# Patient Record
Sex: Female | Born: 1950 | Race: Black or African American | Hispanic: No | Marital: Married | Smoking: Former smoker
Health system: Southern US, Community
[De-identification: ages and names within clinical notes are randomized; demographics above are authoritative.]

## PROBLEM LIST (undated history)

## (undated) DIAGNOSIS — E119 Type 2 diabetes mellitus without complications: Secondary | ICD-10-CM

## (undated) DIAGNOSIS — Z720 Tobacco use: Secondary | ICD-10-CM

## (undated) DIAGNOSIS — F32A Depression, unspecified: Secondary | ICD-10-CM

## (undated) DIAGNOSIS — C9110 Chronic lymphocytic leukemia of B-cell type not having achieved remission: Secondary | ICD-10-CM

## (undated) DIAGNOSIS — F329 Major depressive disorder, single episode, unspecified: Secondary | ICD-10-CM

## (undated) DIAGNOSIS — C9590 Leukemia, unspecified not having achieved remission: Secondary | ICD-10-CM

## (undated) DIAGNOSIS — I10 Essential (primary) hypertension: Secondary | ICD-10-CM

## (undated) DIAGNOSIS — D649 Anemia, unspecified: Secondary | ICD-10-CM

## (undated) DIAGNOSIS — E876 Hypokalemia: Secondary | ICD-10-CM

## (undated) DIAGNOSIS — F1020 Alcohol dependence, uncomplicated: Secondary | ICD-10-CM

## (undated) HISTORY — DX: Type 2 diabetes mellitus without complications: E11.9

## (undated) HISTORY — DX: Anemia, unspecified: D64.9

## (undated) HISTORY — DX: Depression, unspecified: F32.A

## (undated) HISTORY — PX: ABDOMINAL HYSTERECTOMY: SUR658

## (undated) HISTORY — DX: Chronic lymphocytic leukemia of B-cell type not having achieved remission: C91.10

## (undated) HISTORY — DX: Tobacco use: Z72.0

## (undated) HISTORY — DX: Alcohol dependence, uncomplicated: F10.20

## (undated) HISTORY — DX: Leukemia, unspecified not having achieved remission: C95.90

## (undated) HISTORY — PX: PORT A CATH INJECTION (ARMC HX): HXRAD1731

## (undated) HISTORY — DX: Hypokalemia: E87.6

## (undated) HISTORY — DX: Major depressive disorder, single episode, unspecified: F32.9

## (undated) HISTORY — DX: Essential (primary) hypertension: I10

---

## 2004-05-15 DIAGNOSIS — F1020 Alcohol dependence, uncomplicated: Secondary | ICD-10-CM

## 2004-05-15 HISTORY — DX: Alcohol dependence, uncomplicated: F10.20

## 2011-12-21 DIAGNOSIS — Z1231 Encounter for screening mammogram for malignant neoplasm of breast: Secondary | ICD-10-CM | POA: Insufficient documentation

## 2011-12-21 DIAGNOSIS — I10 Essential (primary) hypertension: Secondary | ICD-10-CM | POA: Insufficient documentation

## 2011-12-21 DIAGNOSIS — M8580 Other specified disorders of bone density and structure, unspecified site: Secondary | ICD-10-CM | POA: Insufficient documentation

## 2011-12-21 DIAGNOSIS — E7800 Pure hypercholesterolemia, unspecified: Secondary | ICD-10-CM | POA: Insufficient documentation

## 2011-12-21 DIAGNOSIS — Z720 Tobacco use: Secondary | ICD-10-CM

## 2011-12-21 DIAGNOSIS — Z1211 Encounter for screening for malignant neoplasm of colon: Secondary | ICD-10-CM | POA: Insufficient documentation

## 2011-12-21 DIAGNOSIS — K5900 Constipation, unspecified: Secondary | ICD-10-CM | POA: Insufficient documentation

## 2011-12-21 HISTORY — DX: Tobacco use: Z72.0

## 2014-03-30 DIAGNOSIS — C9110 Chronic lymphocytic leukemia of B-cell type not having achieved remission: Secondary | ICD-10-CM | POA: Insufficient documentation

## 2014-04-28 DIAGNOSIS — J019 Acute sinusitis, unspecified: Secondary | ICD-10-CM | POA: Insufficient documentation

## 2014-05-31 DIAGNOSIS — D649 Anemia, unspecified: Secondary | ICD-10-CM

## 2014-05-31 DIAGNOSIS — D6489 Other specified anemias: Secondary | ICD-10-CM

## 2014-05-31 HISTORY — DX: Other specified anemias: D64.89

## 2014-08-17 DIAGNOSIS — T7840XA Allergy, unspecified, initial encounter: Secondary | ICD-10-CM | POA: Insufficient documentation

## 2014-08-17 DIAGNOSIS — Z5111 Encounter for antineoplastic chemotherapy: Secondary | ICD-10-CM | POA: Insufficient documentation

## 2014-09-07 DIAGNOSIS — R61 Generalized hyperhidrosis: Secondary | ICD-10-CM | POA: Insufficient documentation

## 2014-09-07 DIAGNOSIS — R509 Fever, unspecified: Secondary | ICD-10-CM | POA: Insufficient documentation

## 2014-12-21 DIAGNOSIS — Z95828 Presence of other vascular implants and grafts: Secondary | ICD-10-CM | POA: Insufficient documentation

## 2015-02-01 DIAGNOSIS — F329 Major depressive disorder, single episode, unspecified: Secondary | ICD-10-CM | POA: Insufficient documentation

## 2015-06-22 DIAGNOSIS — E876 Hypokalemia: Secondary | ICD-10-CM | POA: Insufficient documentation

## 2015-09-09 ENCOUNTER — Encounter (HOSPITAL_COMMUNITY): Payer: Self-pay

## 2015-09-15 ENCOUNTER — Encounter (HOSPITAL_COMMUNITY): Payer: Self-pay | Admitting: Oncology

## 2015-09-15 ENCOUNTER — Encounter (HOSPITAL_COMMUNITY): Payer: BLUE CROSS/BLUE SHIELD | Attending: Oncology | Admitting: Oncology

## 2015-09-15 VITALS — BP 165/68 | HR 55 | Temp 98.2°F | Resp 18 | Ht 64.0 in | Wt 165.8 lb

## 2015-09-15 DIAGNOSIS — R110 Nausea: Secondary | ICD-10-CM | POA: Diagnosis not present

## 2015-09-15 DIAGNOSIS — C9110 Chronic lymphocytic leukemia of B-cell type not having achieved remission: Secondary | ICD-10-CM

## 2015-09-15 DIAGNOSIS — F341 Dysthymic disorder: Secondary | ICD-10-CM | POA: Diagnosis not present

## 2015-09-15 DIAGNOSIS — F329 Major depressive disorder, single episode, unspecified: Secondary | ICD-10-CM

## 2015-09-15 DIAGNOSIS — I10 Essential (primary) hypertension: Secondary | ICD-10-CM | POA: Insufficient documentation

## 2015-09-15 DIAGNOSIS — Z87891 Personal history of nicotine dependence: Secondary | ICD-10-CM

## 2015-09-15 DIAGNOSIS — F4321 Adjustment disorder with depressed mood: Secondary | ICD-10-CM

## 2015-09-15 MED ORDER — IBRUTINIB 140 MG PO CAPS: 420.0000 mg | ORAL_CAPSULE | Freq: Every day | ORAL | 0 refills | Status: DC

## 2015-09-15 NOTE — Assessment & Plan Note (Addendum)
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  Initially treated with BR by Dr. [name] at [facility].  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  Oncology history is developed.  Labs in 2 weeks with port flush: CBC diff, CMET, LDH.  I personally reviewed and went over laboratory results with the patient.  The results are noted within this dictation.  Labs in ~ 8 weeks: CBC diff, CMET, LDH.  Port flushes every 6-8 weeks.  She notes persistent nausea and she is provided recommendations regarding her antiemetic regimen to combat this issue.  Compliance is encouraged with Imbruvica.  Return in approximately 8 weeks for follow-up.

## 2015-09-15 NOTE — Patient Instructions (Signed)
[place] at [facility] Discharge Instructions  RECOMMENDATIONS MADE BY THE CONSULTANT AND ANY TEST RESULTS WILL BE SENT TO YOUR REFERRING PHYSICIAN.  Labs in 2 weeks with port flush. Port flushes every 6 weeks. Return for follow-up in ~ 8 weeks.  Thank you for choosing [place] at [facility] to provide your oncology and hematology care.  To afford each patient quality time with our provider, please arrive at least 15 minutes before your scheduled appointment time.   Beginning January 23rd 2017 lab work for the [facility] will be done in the  Main lab at [facility] on 1st floor. If you have a lab appointment with the [place] please come in thru the  Main Entrance and check in at the main information desk  You need to re-schedule your appointment should you arrive 10 or more minutes late.  We strive to give you quality time with our providers, and arriving late affects you and other patients whose appointments are after yours.  Also, if you no show three or more times for appointments you may be dismissed from the clinic at the providers discretion.     Again, thank you for choosing [facility].  Our hope is that these requests will decrease the amount of time that you wait before being seen by our physicians.       _____________________________________________________________  Should you have questions after your visit to [facility], please contact our office at [phone] between the hours of 8:30 a.m. and 4:30 p.m.  Voicemails left after 4:30 p.m. will not be returned until the following business day.  For prescription refill requests, have your pharmacy contact our office.         Resources For Cancer Patients and their Caregivers ? American Cancer Society: Can assist with transportation, wigs, general needs, runs Look Good Feel Better.        971-444-1594 ? Cancer Care: Provides financial  assistance, online support groups, medication/co-pay assistance.  1-800-813-HOPE 206-539-7198) ? [place] Assists [place] cancer patients and their families through emotional , educational and financial support.  813-131-5501 ? [place] DSS Where to apply for food stamps, Medicaid and utility assistance. [phone] [phone] ? RCATS: Transportation to medical appointments. 4162478238 ? Social Security Administration: May apply for disability if have a Stage IV cancer. 706-305-5891 [phone] ? [employer], Disability and Transit Services: Assists with nutrition, care and transit needs. [place] Support Programs: @10RELATIVEDAYS @ > Cancer Support Group  2nd Tuesday of the month 1pm-2pm, Journey Room  > Creative Journey  3rd Tuesday of the month 1130am-1pm, Journey Room  > Look Good Feel Better  1st Wednesday of the month 10am-12 noon, Journey Room (Call [place] to register [phone])

## 2015-09-16 ENCOUNTER — Encounter (HOSPITAL_COMMUNITY): Payer: Self-pay | Admitting: Oncology

## 2015-09-16 NOTE — Progress Notes (Signed)
[facility] Hematology/Oncology Consultation   Name: Brittany Rowland      MRN: [id number]   Date: 09/16/2015 Time:9:52 PM   REFERRING PHYSICIAN:  Everardo All, MD [facility])  REASON FOR CONSULT:  Transfer of medical oncology care.   DIAGNOSIS:  CLL    CLL (chronic lymphocytic leukemia) ([place])   12/16/2011 Initial Diagnosis    CLL (chronic lymphocytic leukemia), stage A ([place]), stage 0 (Rai)      01/13/2013 Pathology Results    Monoclonal B cell population is detected with kapp light chain restriction, representing 82% of peripheral leukocytes. B cell population expresses CD19, CD20, CD11C, CD23, and aberrant CD5. No blasts.      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        03/30/2014 Cancer Staging    CLL Stage B ([place]), Stage I (Rai), now with doubling of WBC and approximate 6 months and widespread adenopathy, dropping hemoglobin, but not under 11 g/dL yet, fatigue and weakness      05/31/2014 Cancer Staging    Stage III by both Binet and Rai staging systems, HGB 10.6 g/dL.      07/07/2014 Imaging    CT CAP- extensive adenopathy w/in low neck, chest, abdomen, and pelvis.  Centrilobular emphysema. Nonspecific 5 mm LLL pulmonary nodule. Nonspecific focal left-sided pleural nodularity. 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      08/02/2014 - 11/02/2014 Chemotherapy    Bendamustine/Rituxan x 4 cycles with normalization of HGB and WBC and PLT count with disappearance of all symptoms and lymph nodes      09/07/2014 Adverse Reaction    Hospitalization with fever and chills, acute bronchitis, negative blood cultures. Comlicated by Levaquin skin rash.  Bendamustine dose reduced for next cycle.      09/08/2014 Echocardiogram    Left ventricular wall motion and contractility are within normal limits.  The estimated ejection fraction is greater than 65%.  Normal left  ventricular diastolic filling is observed.      12/21/2014 Treatment Plan Change    Chemotherapy discontinued following 4 cycles of therapy due to tremendouns response and disappearance of all symptoms and lymph nodes.      12/21/2014 Remission         06/22/2015 Relapse/Recurrence    Per Dr. [name]: "Now relapsing changes in her WBC count, lymphocyte count, LDH level. Before she gets symptomatic again, I think is worthy of starting her on Ibrutinib while she has a great performance status..."      06/29/2015 -  Chemotherapy    Ibrutinib initiated         HISTORY OF PRESENT ILLNESS:  Brittany Rowland is a 65 y.o. female with a medical history significant for HTN, hypercholesterolemia, osteopenia, reactive depression who is referred to the [facility] for transfer of her medical oncology care for CLL, currently on Ibrutinib.  I reviewed the patient's treatment plan with her. She takes 420 mg of Imbruvica daily. There is documentation of issues with compliance. She notes that she may have missed 2 days of treatment last month and, thus far, has missed 2 doses.  She complains of fatigue which is likely multi factorial. She does work third shift. She denies any B symptoms. She feels that overall she does well with therapy. She denies nausea with her medication (although chronic), no vomiting. No change in bowel habits.   She does have reactive  depression secondary to the death of her mother. She and her mother live together and this is caused a significant life change the patient.  Treatment course of her CLL is detailed above under oncology history.   Review of Systems  Constitutional: Positive for malaise/fatigue (chronic). Negative for chills and fever.  HENT: Negative.   Eyes: Negative.   Respiratory: Negative.  Negative for cough and shortness of breath.   Cardiovascular: Negative.  Negative for chest pain and palpitations.  Gastrointestinal: Negative.  Negative for  abdominal pain, constipation, diarrhea, nausea and vomiting.  Genitourinary: Negative.  Negative for dysuria.  Musculoskeletal: Negative.   Skin: Negative.  Negative for itching and rash.  Neurological: Negative.  Negative for weakness.  Endo/Heme/Allergies: Negative.   Psychiatric/Behavioral: Positive for depression.  14 point review of systems was performed and is negative except as detailed under history of present illness and above    PAST MEDICAL HISTORY:   Past Medical History:  Diagnosis Date  . Anemia   . CLL (chronic lymphocytic leukemia) ([place])   . Depression   . Diabetes mellitus without complication ([place])   . Hypertension   . Hypokalemia   . Leukemia (HCC)     ALLERGIES: Allergies  Allergen Reactions  . Aspirin Nausea And Vomiting  . Levaquin  [Levofloxacin In D5w] Rash      MEDICATIONS: I have reviewed the patient's current medications.    No current outpatient prescriptions on file prior to visit.   No current facility-administered medications on file prior to visit.      PAST SURGICAL HISTORY History reviewed. No pertinent surgical history.  FAMILY HISTORY: Family History  Problem Relation Age of Onset  . Heart failure Mother   . Diabetes Mother   . Hypertension Mother   . Heart failure Father   Mothers deceased at the age of 34 secondary to complications of congestive heart failure. Father is still alive at the age of 13 and living in a nursing home.   SOCIAL HISTORY:  reports that she quit smoking about a year ago. Her smoking use included Cigarettes. She has a 45.00 pack-year smoking history. She has quit using smokeless tobacco. She reports that she drinks about 1.2 oz of alcohol per week . She reports that she does not use drugs. She enjoys having a drink of brandy once per week. She admits to being a [place], [place]. 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 She is married 22 years. She has no children.  Social History   Social  History  . Marital status: Married    Spouse name: N/A  . Number of children: N/A  . Years of education: N/A   Social History Main Topics  . Smoking status: Former Smoker    Packs/day: 1.00    Years: 45.00    Types: Cigarettes    Quit date: 09/15/2014  . Smokeless tobacco: Former Systems developer  . Alcohol use 1.2 oz/week    2 Shots of liquor per week  . Drug use: No  . Sexual activity: Not Asked   Other Topics Concern  . None   Social History Narrative  . None    PERFORMANCE STATUS: The patient's performance status is 1 - Symptomatic but completely ambulatory  PHYSICAL EXAM: Most Recent Vital Signs: Blood pressure (!) 165/68, pulse (!) 55, temperature 98.2 F (36.8 C), temperature source Oral, resp. rate 18, height 5\' 4"  (1.626 m), weight 165 lb 12.8 oz (75.2 kg), SpO2 99 %. General appearance: alert, cooperative, appears  stated age, fatigued, no distress and moderately obese Head: Normocephalic, without obvious abnormality, atraumatic Eyes: negative findings: lids and lashes normal, conjunctivae and sclerae normal and corneas clear Throat: lips, mucosa, and tongue normal; teeth and gums normal Lungs: clear to auscultation bilaterally and normal percussion bilaterally Heart: regular rate and rhythm, S1, S2 normal, no murmur, click, rub or gallop Abdomen: soft, non-tender; bowel sounds normal; no masses,  no organomegaly Extremities: extremities normal, atraumatic, no cyanosis or edema Skin: Skin color, texture, turgor normal. No rashes or lesions Lymph nodes: Cervical, supraclavicular, and axillary nodes normal. Neurologic: Grossly normal  LABORATORY DATA:  No results found for this or any previous visit (from the past 48 hour(s)).         RADIOGRAPHY: No results found.     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 She infrequently misses  doses. She seems to have no difficulty with tolerance. Medication was initiated by Dr. [name] in May of this year.  Initially treated with BR by Dr. [name] at [facility].  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 Records indicate this is her sole genetic abnormaltiy.   Oncology history is developed.  Labs in 2 weeks with port flush: CBC diff, CMET, LDH.  I personally reviewed and went over laboratory results with the patient.  The results are noted within this dictation.  Labs in ~ 8 weeks: CBC diff, CMET, LDH.  Port flushes every 6-8 weeks.  She notes persistent nausea and she is provided recommendations regarding her antiemetic regimen to combat this issue. She does not tie her nausea specifically to her medication. If it persists we could consider referral to GI.   Compliance is encouraged with Imbruvica.  She still has some depression and difficulty dealing with her mother's death but she feels she is slowly improving.   Return in approximately 8 weeks for follow-up.   ORDERS PLACED FOR THIS ENCOUNTER: Orders Placed This Encounter  Procedures  . CBC with Differential  . Comprehensive metabolic panel  . Lactate dehydrogenase  . CBC with Differential  . Comprehensive metabolic panel  . Lactate dehydrogenase    MEDICATIONS PRESCRIBED THIS ENCOUNTER: Meds ordered this encounter  Medications  . ALPRAZolam (XANAX) 0.25 MG tablet    Sig: Take one tablet by mouth every 6 to 8 hours as needed for anxiety.  . Multiple Vitamins-Minerals (THERA-M) TABS    Sig: Take by mouth.  . potassium chloride (K-DUR) 10 MEQ tablet    Sig: Take by mouth.  . DISCONTD: ibrutinib (IMBRUVICA) 140 MG capsul    Sig: Take 3 caps daily as directed by oncologist.  . ibrutinib (IMBRUVICA) 140 MG capsul    Sig: Take 3 capsules (420 mg total) by mouth daily.    Dispense:  90 capsule    Refill:  0    Order Specific Question:    Supervising Provider    Answer:   Patrici Ranks [id number]    All questions were answered. The patient knows to call the clinic with any problems, questions or concerns. We can certainly see the patient much sooner if necessary.  This note is electronically signed by: Molli Hazard, MD  :09/16/2015 9:52 PM

## 2015-10-03 ENCOUNTER — Encounter (HOSPITAL_COMMUNITY): Payer: BLUE CROSS/BLUE SHIELD

## 2015-10-05 ENCOUNTER — Encounter (HOSPITAL_COMMUNITY): Payer: BLUE CROSS/BLUE SHIELD | Attending: Oncology

## 2015-10-05 VITALS — BP 180/71 | HR 58 | Temp 98.1°F | Resp 18 | Wt 165.0 lb

## 2015-10-05 DIAGNOSIS — Z95828 Presence of other vascular implants and grafts: Secondary | ICD-10-CM | POA: Diagnosis present

## 2015-10-05 DIAGNOSIS — Z452 Encounter for adjustment and management of vascular access device: Secondary | ICD-10-CM | POA: Diagnosis not present

## 2015-10-05 DIAGNOSIS — C9110 Chronic lymphocytic leukemia of B-cell type not having achieved remission: Secondary | ICD-10-CM | POA: Diagnosis not present

## 2015-10-05 LAB — LACTATE DEHYDROGENASE: LDH: 190 U/L (ref 98–192)

## 2015-10-05 LAB — CBC WITH DIFFERENTIAL/PLATELET
BASOS ABS: 0 10*3/uL (ref 0.0–0.1)
Basophils Relative: 0 %
Eosinophils Absolute: 0.1 10*3/uL (ref 0.0–0.7)
Eosinophils Relative: 1 %
HEMATOCRIT: 37 % (ref 36.0–46.0)
HEMOGLOBIN: 12.2 g/dL (ref 12.0–15.0)
LYMPHS PCT: 75 %
Lymphs Abs: 11.2 10*3/uL — ABNORMAL HIGH (ref 0.7–4.0)
MCH: 28.2 pg (ref 26.0–34.0)
MCHC: 33 g/dL (ref 30.0–36.0)
MCV: 85.6 fL (ref 78.0–100.0)
MONOS PCT: 4 %
Monocytes Absolute: 0.6 10*3/uL (ref 0.1–1.0)
NEUTROS PCT: 20 %
Neutro Abs: 3 10*3/uL (ref 1.7–7.7)
Platelets: 258 10*3/uL (ref 150–400)
RBC: 4.32 MIL/uL (ref 3.87–5.11)
RDW: 13.9 % (ref 11.5–15.5)
WBC: 14.9 10*3/uL — AB (ref 4.0–10.5)

## 2015-10-05 LAB — COMPREHENSIVE METABOLIC PANEL
ALBUMIN: 4.1 g/dL (ref 3.5–5.0)
ALK PHOS: 43 U/L (ref 38–126)
ALT: 16 U/L (ref 14–54)
AST: 23 U/L (ref 15–41)
Anion gap: 8 (ref 5–15)
BILIRUBIN TOTAL: 0.8 mg/dL (ref 0.3–1.2)
BUN: 15 mg/dL (ref 6–20)
CO2: 22 mmol/L (ref 22–32)
CREATININE: 0.83 mg/dL (ref 0.44–1.00)
Calcium: 8.9 mg/dL (ref 8.9–10.3)
Chloride: 108 mmol/L (ref 101–111)
GFR calc Af Amer: >60 mL/min (ref >60)
GFR calc non Af Amer: >60 mL/min (ref >60)
GLUCOSE: 116 mg/dL — AB (ref 65–99)
Potassium: 3 mmol/L — ABNORMAL LOW (ref 3.5–5.1)
Sodium: 138 mmol/L (ref 135–145)
Total Protein: 6.9 g/dL (ref 6.5–8.1)

## 2015-10-05 MED ORDER — SODIUM CHLORIDE 0.9% FLUSH
20.0000 mL | INTRAVENOUS | Status: DC | PRN
  Administered 2015-10-05: 20 mL via INTRAVENOUS
  Filled 2015-10-05: qty 20

## 2015-10-05 MED ORDER — HEPARIN SOD (PORK) LOCK FLUSH 100 UNIT/ML IV SOLN
500.0000 [IU] | Freq: Once | INTRAVENOUS | Status: AC
  Administered 2015-10-05: 500 [IU] via INTRAVENOUS

## 2015-10-05 NOTE — Progress Notes (Signed)
Brittany Rowland presented for Portacath access and flush.  Proper placement of portacath confirmed by CXR.  Portacath located right chest wall accessed with  H 20 needle.  Good blood return present. Labs drawn thru port as ordered. Portacath flushed with 36ml NS and 500U/3ml Heparin and needle removed intact.  Procedure tolerated well and without incident.

## 2015-10-05 NOTE — Patient Instructions (Signed)
[place] at [facility] Discharge Instructions  RECOMMENDATIONS MADE BY THE CONSULTANT AND ANY TEST RESULTS WILL BE SENT TO YOUR REFERRING PHYSICIAN.  Port flushed with labs drawn today. Follow-up as scheduled. Call clinic for any questions or concerns  Thank you for choosing [place] at [facility] to provide your oncology and hematology care.  To afford each patient quality time with our provider, please arrive at least 15 minutes before your scheduled appointment time.   Beginning January 23rd 2017 lab work for the [facility] will be done in the  Main lab at [facility] on 1st floor. If you have a lab appointment with the [place] please come in thru the  Main Entrance and check in at the main information desk  You need to re-schedule your appointment should you arrive 10 or more minutes late.  We strive to give you quality time with our providers, and arriving late affects you and other patients whose appointments are after yours.  Also, if you no show three or more times for appointments you may be dismissed from the clinic at the providers discretion.     Again, thank you for choosing [facility].  Our hope is that these requests will decrease the amount of time that you wait before being seen by our physicians.       _____________________________________________________________  Should you have questions after your visit to [facility], please contact our office at [phone] between the hours of 8:30 a.m. and 4:30 p.m.  Voicemails left after 4:30 p.m. will not be returned until the following business day.  For prescription refill requests, have your pharmacy contact our office.         Resources For Cancer Patients and their Caregivers ? American Cancer Society: Can assist with transportation, wigs, general needs, runs Look Good Feel Better.        [phone] ? Cancer Care: Provides  financial assistance, online support groups, medication/co-pay assistance.  1-800-813-HOPE 610 308 7896) ? [place] Assists [place] cancer patients and their families through emotional , educational and financial support.  413-034-5201 ? [place] DSS Where to apply for food stamps, Medicaid and utility assistance. [phone] ? RCATS: Transportation to medical appointments. 8165963524 ? Social Security Administration: May apply for disability if have a Stage IV cancer. 639-751-5843 [phone] ? [employer], Disability and Transit Services: Assists with nutrition, care and transit needs. [place] Support Programs: @10RELATIVEDAYS @ > Cancer Support Group  2nd Tuesday of the month 1pm-2pm, Journey Room  > Creative Journey  3rd Tuesday of the month 1130am-1pm, Journey Room  > Look Good Feel Better  1st Wednesday of the month 10am-12 noon, Journey Room (Call [place] to register [phone])

## 2015-10-06 ENCOUNTER — Other Ambulatory Visit (HOSPITAL_COMMUNITY): Payer: Self-pay | Admitting: Oncology

## 2015-10-06 DIAGNOSIS — E876 Hypokalemia: Secondary | ICD-10-CM

## 2015-10-09 ENCOUNTER — Encounter (HOSPITAL_COMMUNITY): Payer: Self-pay | Admitting: Oncology

## 2015-10-11 ENCOUNTER — Telehealth (HOSPITAL_COMMUNITY): Payer: Self-pay

## 2015-10-11 DIAGNOSIS — E876 Hypokalemia: Secondary | ICD-10-CM

## 2015-10-11 MED ORDER — POTASSIUM CHLORIDE CRYS ER 20 MEQ PO TBCR: 20.0000 meq | EXTENDED_RELEASE_TABLET | Freq: Two times a day (BID) | ORAL | 0 refills | Status: DC

## 2015-10-11 NOTE — Telephone Encounter (Signed)
-----   Message from Baird Cancer, PA-C sent at 10/06/2015  4:24 PM EDT ----- Stable.  Hypokalemia noted.  Please escribed Kdur 20 mEq BID #60 with 0 refills.  She does not have a pharmacy listed so we will need to call her and ask what pharmacy to use.

## 2015-10-11 NOTE — Telephone Encounter (Signed)
Notified patient of hypokalemia. She uses [facility] in [place]. k-dur 92meq 1 po BID #60 with 0 refills escribed to pharmacy. Patient verbalized understanding.

## 2015-10-14 ENCOUNTER — Other Ambulatory Visit (HOSPITAL_COMMUNITY): Payer: Self-pay | Admitting: Oncology

## 2015-10-14 DIAGNOSIS — C9110 Chronic lymphocytic leukemia of B-cell type not having achieved remission: Secondary | ICD-10-CM

## 2015-10-14 MED ORDER — IBRUTINIB 140 MG PO CAPS: 420.0000 mg | ORAL_CAPSULE | Freq: Every day | ORAL | 0 refills | Status: DC

## 2015-11-14 ENCOUNTER — Encounter (HOSPITAL_COMMUNITY): Payer: BLUE CROSS/BLUE SHIELD

## 2015-11-14 ENCOUNTER — Ambulatory Visit (HOSPITAL_COMMUNITY): Payer: BLUE CROSS/BLUE SHIELD | Admitting: Hematology & Oncology

## 2015-11-16 ENCOUNTER — Ambulatory Visit (HOSPITAL_COMMUNITY): Payer: BLUE CROSS/BLUE SHIELD | Admitting: Hematology & Oncology

## 2015-11-16 ENCOUNTER — Encounter (HOSPITAL_COMMUNITY): Payer: BLUE CROSS/BLUE SHIELD | Attending: Oncology

## 2015-11-16 ENCOUNTER — Other Ambulatory Visit (HOSPITAL_COMMUNITY): Payer: Self-pay | Admitting: Oncology

## 2015-11-16 DIAGNOSIS — Z23 Encounter for immunization: Secondary | ICD-10-CM

## 2015-11-16 DIAGNOSIS — C9110 Chronic lymphocytic leukemia of B-cell type not having achieved remission: Secondary | ICD-10-CM | POA: Insufficient documentation

## 2015-11-16 DIAGNOSIS — Z95828 Presence of other vascular implants and grafts: Secondary | ICD-10-CM | POA: Insufficient documentation

## 2015-11-16 DIAGNOSIS — E876 Hypokalemia: Secondary | ICD-10-CM

## 2015-11-16 LAB — CBC WITH DIFFERENTIAL/PLATELET
BASOS ABS: 0 10*3/uL (ref 0.0–0.1)
BASOS PCT: 0 %
EOS ABS: 0.1 10*3/uL (ref 0.0–0.7)
Eosinophils Relative: 1 %
HCT: 37.4 % (ref 36.0–46.0)
Hemoglobin: 12.3 g/dL (ref 12.0–15.0)
Lymphocytes Relative: 81 %
Lymphs Abs: 11.3 10*3/uL — ABNORMAL HIGH (ref 0.7–4.0)
MCH: 28.1 pg (ref 26.0–34.0)
MCHC: 32.9 g/dL (ref 30.0–36.0)
MCV: 85.6 fL (ref 78.0–100.0)
MONO ABS: 0.6 10*3/uL (ref 0.1–1.0)
Monocytes Relative: 4 %
NEUTROS PCT: 14 %
Neutro Abs: 1.9 10*3/uL (ref 1.7–7.7)
PLATELETS: 250 10*3/uL (ref 150–400)
RBC: 4.37 MIL/uL (ref 3.87–5.11)
RDW: 14.5 % (ref 11.5–15.5)
WBC: 13.9 10*3/uL — ABNORMAL HIGH (ref 4.0–10.5)

## 2015-11-16 LAB — COMPREHENSIVE METABOLIC PANEL
ALT: 15 U/L (ref 14–54)
AST: 19 U/L (ref 15–41)
Albumin: 4.1 g/dL (ref 3.5–5.0)
Alkaline Phosphatase: 46 U/L (ref 38–126)
Anion gap: 4 — ABNORMAL LOW (ref 5–15)
BUN: 19 mg/dL (ref 6–20)
CALCIUM: 8.8 mg/dL — AB (ref 8.9–10.3)
CHLORIDE: 107 mmol/L (ref 101–111)
CO2: 25 mmol/L (ref 22–32)
CREATININE: 1.08 mg/dL — AB (ref 0.44–1.00)
GFR, EST NON AFRICAN AMERICAN: 53 mL/min — AB (ref >60)
Glucose, Bld: 121 mg/dL — ABNORMAL HIGH (ref 65–99)
POTASSIUM: 2.9 mmol/L — AB (ref 3.5–5.1)
Sodium: 136 mmol/L (ref 135–145)
TOTAL PROTEIN: 6.9 g/dL (ref 6.5–8.1)
Total Bilirubin: 0.6 mg/dL (ref 0.3–1.2)

## 2015-11-16 LAB — LACTATE DEHYDROGENASE: LDH: 167 U/L (ref 98–192)

## 2015-11-16 MED ORDER — HEPARIN SOD (PORK) LOCK FLUSH 100 UNIT/ML IV SOLN
INTRAVENOUS | Status: AC
  Filled 2015-11-16: qty 5

## 2015-11-16 MED ORDER — SODIUM CHLORIDE 0.9% FLUSH
20.0000 mL | INTRAVENOUS | Status: DC | PRN
  Administered 2015-11-16: 20 mL via INTRAVENOUS
  Filled 2015-11-16: qty 20

## 2015-11-16 MED ORDER — HEPARIN SOD (PORK) LOCK FLUSH 100 UNIT/ML IV SOLN
500.0000 [IU] | Freq: Once | INTRAVENOUS | Status: AC
  Administered 2015-11-16: 500 [IU] via INTRAVENOUS

## 2015-11-16 MED ORDER — INFLUENZA VAC SPLIT QUAD 0.5 ML IM SUSY
0.5000 mL | PREFILLED_SYRINGE | Freq: Once | INTRAMUSCULAR | Status: AC
  Administered 2015-11-16: 0.5 mL via INTRAMUSCULAR

## 2015-11-16 MED ORDER — INFLUENZA VAC SPLIT QUAD 0.5 ML IM SUSY
PREFILLED_SYRINGE | INTRAMUSCULAR | Status: AC
  Filled 2015-11-16: qty 0.5

## 2015-11-16 NOTE — Patient Instructions (Signed)
[place] at [facility] Discharge Instructions  RECOMMENDATIONS MADE BY THE CONSULTANT AND ANY TEST RESULTS WILL BE SENT TO YOUR REFERRING PHYSICIAN.  Port flush with labs and flu vaccination today.    Thank you for choosing [place] at [facility] to provide your oncology and hematology care.  To afford each patient quality time with our provider, please arrive at least 15 minutes before your scheduled appointment time.   Beginning January 23rd 2017 lab work for the [facility] will be done in the  Main lab at [facility] on 1st floor. If you have a lab appointment with the [place] please come in thru the  Main Entrance and check in at the main information desk  You need to re-schedule your appointment should you arrive 10 or more minutes late.  We strive to give you quality time with our providers, and arriving late affects you and other patients whose appointments are after yours.  Also, if you no show three or more times for appointments you may be dismissed from the clinic at the providers discretion.     Again, thank you for choosing [facility].  Our hope is that these requests will decrease the amount of time that you wait before being seen by our physicians.       _____________________________________________________________  Should you have questions after your visit to [facility], please contact our office at [phone] between the hours of 8:30 a.m. and 4:30 p.m.  Voicemails left after 4:30 p.m. will not be returned until the following business day.  For prescription refill requests, have your pharmacy contact our office.         Resources For Cancer Patients and their Caregivers ? American Cancer Society: Can assist with transportation, wigs, general needs, runs Look Good Feel Better.        [phone] [phone] ? Cancer Care: Provides financial assistance, online support groups,  medication/co-pay assistance.  1-800-813-HOPE 860-212-8330) ? [place] Assists [place] cancer patients and their families through emotional , educational and financial support.  210-701-2476 ? [place] DSS Where to apply for food stamps, Medicaid and utility assistance. 330-697-9537 ? RCATS: Transportation to medical appointments. 714-515-3006 ? Social Security Administration: May apply for disability if have a Stage IV cancer. [phone] ? [employer], Disability and Transit Services: Assists with nutrition, care and transit needs. [place] Support Programs: @10RELATIVEDAYS @ > Cancer Support Group  2nd Tuesday of the month 1pm-2pm, Journey Room  > Creative Journey  3rd Tuesday of the month 1130am-1pm, Journey Room  > Look Good Feel Better  1st Wednesday of the month 10am-12 noon, Journey Room (Call [place] to register [phone])

## 2015-11-16 NOTE — Progress Notes (Signed)
[name] presented for Portacath access and flush. Proper placement of portacath confirmed by CXR. Portacath located right chest wall accessed with  H 20 needle. Good blood return present.  Specimen drawn for labs.   Portacath flushed with 21ml NS and 500U/73ml Heparin and needle removed intact. Procedure without incident. Patient tolerated procedure well. Brittany Rowland presents today for injection per MD orders. Flu vaccination administered IM in right Upper Arm. Administration without incident. Patient tolerated well.

## 2015-11-17 ENCOUNTER — Telehealth (HOSPITAL_COMMUNITY): Payer: Self-pay | Admitting: *Deleted

## 2015-11-17 NOTE — Telephone Encounter (Signed)
-----   Message from Baird Cancer, PA-C sent at 11/16/2015  6:20 PM EDT ----- Stable, but K+ is nearly critically low.  Is she taking K+?  She must take 60 mEq daily and repeat K+ in 1 week.  Please give refill if needed.  Order placed for K+, magnesium, and phosphorus.

## 2015-11-18 ENCOUNTER — Other Ambulatory Visit (HOSPITAL_COMMUNITY): Payer: Self-pay | Admitting: *Deleted

## 2015-11-18 MED ORDER — POTASSIUM CHLORIDE CRYS ER 20 MEQ PO TBCR: 20.0000 meq | EXTENDED_RELEASE_TABLET | Freq: Three times a day (TID) | ORAL | 1 refills | Status: DC

## 2015-11-23 ENCOUNTER — Other Ambulatory Visit (HOSPITAL_COMMUNITY): Payer: BLUE CROSS/BLUE SHIELD

## 2015-11-24 ENCOUNTER — Other Ambulatory Visit (HOSPITAL_COMMUNITY): Payer: BLUE CROSS/BLUE SHIELD

## 2015-11-30 ENCOUNTER — Encounter (HOSPITAL_COMMUNITY): Payer: BLUE CROSS/BLUE SHIELD | Attending: Oncology

## 2015-11-30 DIAGNOSIS — E876 Hypokalemia: Secondary | ICD-10-CM | POA: Diagnosis present

## 2015-11-30 LAB — POTASSIUM: POTASSIUM: 3.6 mmol/L (ref 3.5–5.1)

## 2015-11-30 LAB — PHOSPHORUS: Phosphorus: 2.7 mg/dL (ref 2.5–4.6)

## 2015-11-30 LAB — MAGNESIUM: MAGNESIUM: 2 mg/dL (ref 1.7–2.4)

## 2015-12-20 ENCOUNTER — Other Ambulatory Visit (HOSPITAL_COMMUNITY): Payer: Self-pay | Admitting: Oncology

## 2015-12-20 DIAGNOSIS — C9110 Chronic lymphocytic leukemia of B-cell type not having achieved remission: Secondary | ICD-10-CM

## 2015-12-20 MED ORDER — IBRUTINIB 140 MG PO CAPS: 420.0000 mg | ORAL_CAPSULE | Freq: Every day | ORAL | 0 refills | Status: DC

## 2015-12-21 ENCOUNTER — Encounter (HOSPITAL_COMMUNITY): Payer: Self-pay | Admitting: Hematology & Oncology

## 2015-12-21 ENCOUNTER — Encounter (HOSPITAL_COMMUNITY): Payer: BLUE CROSS/BLUE SHIELD | Attending: Oncology | Admitting: Hematology & Oncology

## 2015-12-21 VITALS — BP 131/68 | HR 80 | Temp 98.3°F | Resp 18 | Wt 162.3 lb

## 2015-12-21 DIAGNOSIS — K521 Toxic gastroenteritis and colitis: Secondary | ICD-10-CM | POA: Diagnosis not present

## 2015-12-21 DIAGNOSIS — R110 Nausea: Secondary | ICD-10-CM

## 2015-12-21 DIAGNOSIS — Z95828 Presence of other vascular implants and grafts: Secondary | ICD-10-CM

## 2015-12-21 DIAGNOSIS — T451X5A Adverse effect of antineoplastic and immunosuppressive drugs, initial encounter: Secondary | ICD-10-CM

## 2015-12-21 DIAGNOSIS — C9112 Chronic lymphocytic leukemia of B-cell type in relapse: Secondary | ICD-10-CM

## 2015-12-21 DIAGNOSIS — C9110 Chronic lymphocytic leukemia of B-cell type not having achieved remission: Secondary | ICD-10-CM

## 2015-12-21 DIAGNOSIS — F418 Other specified anxiety disorders: Secondary | ICD-10-CM

## 2015-12-21 DIAGNOSIS — F329 Major depressive disorder, single episode, unspecified: Secondary | ICD-10-CM

## 2015-12-21 NOTE — Patient Instructions (Signed)
[place] at [facility] Discharge Instructions  RECOMMENDATIONS MADE BY THE CONSULTANT AND ANY TEST RESULTS WILL BE SENT TO YOUR REFERRING PHYSICIAN.  You saw Dr.Penland today. Take 1/2 to 1 capsule Imodium every morning for diarrhea. Follow up in 3 months with labs.  See Amy at checkout for appointments.  Thank you for choosing [place] at [facility] to provide your oncology and hematology care.  To afford each patient quality time with our provider, please arrive at least 15 minutes before your scheduled appointment time.   Beginning January 23rd 2017 lab work for the [facility] will be done in the  Main lab at [facility] on 1st floor. If you have a lab appointment with the [facility] please come in thru the  Main Entrance and check in at the main information desk  You need to re-schedule your appointment should you arrive 10 or more minutes late.  We strive to give you quality time with our providers, and arriving late affects you and other patients whose appointments are after yours.  Also, if you no show three or more times for appointments you may be dismissed from the clinic at the providers discretion.     Again, thank you for choosing [facility].  Our hope is that these requests will decrease the amount of time that you wait before being seen by our physicians.       _____________________________________________________________  Should you have questions after your visit to [facility], please contact our office at [phone] between the hours of 8:30 a.m. and 4:30 p.m.  Voicemails left after 4:30 p.m. will not be returned until the following business day.  For prescription refill requests, have your pharmacy contact our office.         Resources For Cancer Patients and their Caregivers ? American Cancer Society: Can assist with transportation, wigs, general needs, runs Look Good Feel  Better.        619-828-7465 ? Cancer Care: Provides financial assistance, online support groups, medication/co-pay assistance.  1-800-813-HOPE 334-208-0914) ? [facility] Assists [place] cancer patients and their families through emotional , educational and financial support.  765-197-9982 ? [place] DSS Where to apply for food stamps, Medicaid and utility assistance. (403)223-9213 ? RCATS: Transportation to medical appointments. 4698763374 ? Social Security Administration: May apply for disability if have a Stage IV cancer. 734-456-4939 (670) 525-8586 ? [employer], Disability and Transit Services: Assists with nutrition, care and transit needs. [place] Support Programs: @10RELATIVEDAYS @ > Cancer Support Group  2nd Tuesday of the month 1pm-2pm, Journey Room  > Creative Journey  3rd Tuesday of the month 1130am-1pm, Journey Room  > Look Good Feel Better  1st Wednesday of the month 10am-12 noon, Journey Room (Call [facility] to register [phone])

## 2015-12-21 NOTE — Progress Notes (Signed)
[facility] Hematology/Oncology Progress Note  Name: Brittany Rowland      MRN: [id number]   Date: 12/21/2015 Time:2:57 PM   REFERRING PHYSICIAN:  Everardo All, MD [facility])  REASON FOR CONSULT:  Transfer of medical oncology care.   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 B cell population expresses CD19, CD20, CD11C, CD23, and aberrant CD5. No blasts.      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        03/30/2014 Cancer Staging    CLL Stage B ([place]), Stage I (Rai), now with doubling of WBC and approximate 6 months and widespread adenopathy, dropping hemoglobin, but not under 11 g/dL yet, fatigue and weakness      05/31/2014 Cancer Staging    Stage III by both Binet and Rai staging systems, HGB 10.6 g/dL.      07/07/2014 Imaging    CT CAP- extensive adenopathy w/in low neck, chest, abdomen, and pelvis.  Centrilobular emphysema. Nonspecific 5 mm LLL pulmonary nodule. Nonspecific focal left-sided pleural nodularity. 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      08/02/2014 - 11/02/2014 Chemotherapy    Bendamustine/Rituxan x 4 cycles with normalization of HGB and WBC and PLT count with disappearance of all symptoms and lymph nodes      09/07/2014 Adverse Reaction    Hospitalization with fever and chills, acute bronchitis, negative blood cultures. Comlicated by Levaquin skin rash.  Bendamustine dose reduced for next cycle.      09/08/2014 Echocardiogram    Left ventricular wall motion and contractility are within normal limits.  The estimated ejection fraction is greater than 65%.  Normal left  ventricular diastolic filling is observed.      12/21/2014 Treatment Plan Change    Chemotherapy discontinued following 4 cycles of therapy due to tremendouns response and disappearance of all symptoms and lymph nodes.      12/21/2014 Remission         06/22/2015 Relapse/Recurrence    Per Dr. [name]: "Now relapsing changes in her WBC count, lymphocyte count, LDH level. Before she gets symptomatic again, I think is worthy of starting her on Ibrutinib while she has a great performance status..."      06/29/2015 -  Chemotherapy    Ibrutinib initiated         HISTORY OF PRESENT ILLNESS:  Brittany Rowland is a 65 y.o. female with a medical history significant for HTN, hypercholesterolemia, osteopenia, reactive depression who is referred to the [facility] for transfer of her medical oncology care for CLL, currently on Ibrutinib.  I personally reviewed labs with the patient.  She notes that she has been taking her ibrutinib as prescribed. She denies any problems obtaining her refills.   She states that she has been having nausea and diarrhea. The diarrhea has been "not bad" but it does restrict her from going out of the house sometimes, this is rare.   She has a good appetite. She denies drenching night sweats. Energy is baseline.   She states that she went to the ED on Friday because she was dizzy and had  a cough. She believed it could have been pneumonia. She notes she had a CXR which she was told was ok. She denies heart burn or coughing while she sleeps. When she wakes up, she does cough a lot. She says that she started feeling like "she's trying to catch a cold" ever since she got her flu shot.   Review of Systems  Constitutional: Positive for malaise/fatigue (chronic). Negative for chills and fever.  HENT: Negative.   Eyes: Negative.   Respiratory: Positive for cough. Negative for shortness of breath.   Cardiovascular: Negative.  Negative for chest pain and  palpitations.  Gastrointestinal: Positive for diarrhea and nausea. Negative for constipation, heartburn and vomiting.  Genitourinary: Negative.  Negative for dysuria.  Musculoskeletal: Negative.   Skin: Negative.  Negative for itching and rash.  Neurological: Negative.  Negative for weakness.  Endo/Heme/Allergies: Negative.   Psychiatric/Behavioral: Positive for depression.  14 point review of systems was performed and is negative except as detailed under history of present illness and above    PAST MEDICAL HISTORY:   Past Medical History:  Diagnosis Date  . Anemia   . CLL (chronic lymphocytic leukemia) ([place])   . Depression   . Diabetes mellitus without complication ([place])   . Hypertension   . Hypokalemia   . Leukemia (HCC)     ALLERGIES: Allergies  Allergen Reactions  . Aspirin Nausea And Vomiting  . Levaquin  [Levofloxacin In D5w] Rash      MEDICATIONS: I have reviewed the patient's current medications.    Current Outpatient Prescriptions on File Prior to Visit  Medication Sig Dispense Refill  . ALPRAZolam (XANAX) 0.25 MG tablet Take one tablet by mouth every 6 to 8 hours as needed for anxiety.    Marland Kitchen ibrutinib (IMBRUVICA) 140 MG capsul Take 3 capsules (420 mg total) by mouth daily. 90 capsule 0  . Multiple Vitamins-Minerals (THERA-M) TABS Take by mouth.    . potassium chloride SA (K-DUR,KLOR-CON) 20 MEQ tablet Take 1 tablet (20 mEq total) by mouth 3 (three) times daily. 90 tablet 1   No current facility-administered medications on file prior to visit.      PAST SURGICAL HISTORY No past surgical history on file.  FAMILY HISTORY: Family History  Problem Relation Age of Onset  . Heart failure Mother   . Diabetes Mother   . Hypertension Mother   . Heart failure Father   Mothers deceased at the age of 64 secondary to complications of congestive heart failure. Father is still alive at the age of 21 and living in a nursing home.   SOCIAL HISTORY:  reports that she  quit smoking about 15 months ago. Her smoking use included Cigarettes. She has a 45.00 pack-year smoking history. She has quit using smokeless tobacco. She reports that she drinks about 1.2 oz of alcohol per week . She reports that she does not use drugs. She enjoys having a drink of brandy once per week. She admits to being a [place], [place]. 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 She is married 22 years. She has no children.  Social History   Social History  . Marital status: Married    Spouse name: N/A  . Number of children: N/A  . Years of education: N/A   Social History Main Topics  . Smoking status: Former Smoker    Packs/day: 1.00    Years: 45.00    Types: Cigarettes    Quit date: 09/15/2014  . Smokeless tobacco: Former Systems developer  .  Alcohol use 1.2 oz/week    2 Shots of liquor per week  . Drug use: No  . Sexual activity: Not on file   Other Topics Concern  . Not on file   Social History Narrative  . No narrative on file    PERFORMANCE STATUS: The patient's performance status is 1 - Symptomatic but completely ambulatory  PHYSICAL EXAM: Most Recent Vital Signs: Blood pressure 131/68, pulse 80, temperature 98.3 F (36.8 C), temperature source Oral, resp. rate 18, weight 162 lb 4.8 oz (73.6 kg), SpO2 100 %.    Physical Exam  Constitutional: She is oriented to person, place, and time and well-developed, well-nourished, and in no distress.  HENT:  Head: Normocephalic and atraumatic.  Eyes: EOM are normal. Pupils are equal, round, and reactive to light. No scleral icterus.  Neck: Normal range of motion. Neck supple.  Cardiovascular: Normal rate, regular rhythm and normal heart sounds.   Pulmonary/Chest: Effort normal and breath sounds normal. No respiratory distress.  Abdominal: Soft. Bowel sounds are normal. She exhibits no distension. There is no tenderness. There is no rebound.  Musculoskeletal: Normal range of motion.  Lymphadenopathy:    She has no cervical  adenopathy.  Neurological: She is alert and oriented to person, place, and time. No cranial nerve deficit. Gait normal.  Skin: Skin is warm and dry.  Psychiatric: Mood, memory, affect and judgment normal.  Nursing note and vitals reviewed.   LABORATORY DATA:  No results found for this or any previous visit (from the past 48 hour(s)).       Results for KHALA, TARTE (MRN [id number]) as of 01/15/2016 20:23  Ref. Range 11/16/2015 15:00  Sodium Latest Ref Range: 135 - 145 mmol/L 136  Potassium Latest Ref Range: 3.5 - 5.1 mmol/L 2.9 (L)  Chloride Latest Ref Range: 101 - 111 mmol/L 107  CO2 Latest Ref Range: 22 - 32 mmol/L 25  BUN Latest Ref Range: 6 - 20 mg/dL 19  Creatinine Latest Ref Range: 0.44 - 1.00 mg/dL 1.08 (H)  Calcium Latest Ref Range: 8.9 - 10.3 mg/dL 8.8 (L)  EGFR (Non-African Amer.) Latest Ref Range: >60 mL/min 53 (L)  EGFR (African American) Latest Ref Range: >60 mL/min >60  Glucose Latest Ref Range: 65 - 99 mg/dL 121 (H)  Anion gap Latest Ref Range: 5 - 15  4 (L)  Alkaline Phosphatase Latest Ref Range: 38 - 126 U/L 46  Albumin Latest Ref Range: 3.5 - 5.0 g/dL 4.1  AST Latest Ref Range: 15 - 41 U/L 19  ALT Latest Ref Range: 14 - 54 U/L 15  Total Protein Latest Ref Range: 6.5 - 8.1 g/dL 6.9  Total Bilirubin Latest Ref Range: 0.3 - 1.2 mg/dL 0.6  LDH Latest Ref Range: 98 - 192 U/L 167  WBC Latest Ref Range: 4.0 - 10.5 K/uL 13.9 (H)  RBC Latest Ref Range: 3.87 - 5.11 MIL/uL 4.37  Hemoglobin Latest Ref Range: 12.0 - 15.0 g/dL 12.3  HCT Latest Ref Range: 36.0 - 46.0 % 37.4  MCV Latest Ref Range: 78.0 - 100.0 fL 85.6  MCH Latest Ref Range: 26.0 - 34.0 pg 28.1  MCHC Latest Ref Range: 30.0 - 36.0 g/dL 32.9  RDW Latest Ref Range: 11.5 - 15.5 % 14.5  Platelets Latest Ref Range: 150 - 400 K/uL 250  Neutrophils Latest Units: % 14  Lymphocytes Latest Units: % 81  Monocytes Relative Latest Units: % 4  Eosinophil Latest Units: % 1  Basophil Latest Units: % 0  NEUT# Latest  Ref  Range: 1.7 - 7.7 K/uL 1.9  Lymphocyte # Latest Ref Range: 0.7 - 4.0 K/uL 11.3 (H)  Monocyte # Latest Ref Range: 0.1 - 1.0 K/uL 0.6  Eosinophils Absolute Latest Ref Range: 0.0 - 0.7 K/uL 0.1  Basophils Absolute Latest Ref Range: 0.0 - 0.1 K/uL 0.0  WBC Morphology Unknown ABSOLUTE LYMPHOCY...  Smear Review Unknown SPECIMEN CHECKED ...    RADIOGRAPHY: No results found.     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 She infrequently misses doses. She seems to have no difficulty with tolerance. Medication was initiated by Dr. [name] in May of this year.  Initially treated with BR by Dr. [name] at [facility].  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 Records indicate this is her sole genetic abnormaltiy.   Port flushes every 6-8 weeks.  She notes persistent nausea and she is provided recommendations regarding her antiemetic regimen to combat this issue. She does not tie her nausea specifically to her medication. If it persists we could consider referral to GI.   Compliance is encouraged with Imbruvica.  She will be getting blood work drawn after our visit today. We will call her with the results.   I will write her a prescription for the nausea and she was given instructions for how to use Imodium. I have written her a refill for potassium chloride SA.  She will contact us if her cough worsens. She will return for a follow up in 3 months.   ORDERS PLACED FOR THIS ENCOUNTER: No orders of the defined types were placed in this encounter.  Orders Placed This Encounter  Procedures  . CBC with Differential    Standing Status:   Future    Standing Expiration Date:   12/20/2016  . Comprehensive metabolic panel    Standing Status:   Future    Standing Expiration Date:    12/20/2016  . Lactate dehydrogenase    Standing Status:   Future    Standing Expiration Date:   12/20/2016    All questions were answered. The patient knows to call the clinic with any problems, questions or concerns. We can certainly see the patient much sooner if necessary.  This document serves as a record of services personally performed by Ancil Linsey, MD. It was created on her behalf by Martinique Casey, a trained medical scribe. The creation of this record is based on the scribe's personal observations and the provider's statements to them. This document has been checked and approved by the attending provider.  I have reviewed the above documentation for accuracy and completeness and I agree with the above.  This note is electronically signed by: Molli Hazard, MD  :12/21/2015 2:57 PM

## 2015-12-26 ENCOUNTER — Other Ambulatory Visit (HOSPITAL_COMMUNITY): Payer: Self-pay | Admitting: Oncology

## 2015-12-26 DIAGNOSIS — C9110 Chronic lymphocytic leukemia of B-cell type not having achieved remission: Secondary | ICD-10-CM

## 2015-12-26 MED ORDER — IBRUTINIB 140 MG PO CAPS: 420.0000 mg | ORAL_CAPSULE | Freq: Every day | ORAL | 0 refills | Status: DC

## 2016-01-11 ENCOUNTER — Encounter (HOSPITAL_COMMUNITY): Payer: Self-pay

## 2016-01-11 ENCOUNTER — Encounter (HOSPITAL_BASED_OUTPATIENT_CLINIC_OR_DEPARTMENT_OTHER): Payer: BLUE CROSS/BLUE SHIELD

## 2016-01-11 DIAGNOSIS — C9112 Chronic lymphocytic leukemia of B-cell type in relapse: Secondary | ICD-10-CM

## 2016-01-11 DIAGNOSIS — Z452 Encounter for adjustment and management of vascular access device: Secondary | ICD-10-CM | POA: Diagnosis not present

## 2016-01-11 MED ORDER — HEPARIN SOD (PORK) LOCK FLUSH 100 UNIT/ML IV SOLN
500.0000 [IU] | Freq: Once | INTRAVENOUS | Status: AC
  Administered 2016-01-11: 500 [IU] via INTRAVENOUS
  Filled 2016-01-11: qty 5

## 2016-01-11 MED ORDER — SODIUM CHLORIDE 0.9% FLUSH
10.0000 mL | INTRAVENOUS | Status: DC | PRN
  Administered 2016-01-11: 10 mL via INTRAVENOUS
  Filled 2016-01-11: qty 10

## 2016-01-11 NOTE — Patient Instructions (Signed)
[facility] at [facility] Discharge Instructions  RECOMMENDATIONS MADE BY THE CONSULTANT AND ANY TEST RESULTS WILL BE SENT TO YOUR REFERRING PHYSICIAN.  Port flush done today. Follow up as scheduled.  Thank you for choosing [facility] at [facility] to provide your oncology and hematology care.  To afford each patient quality time with our provider, please arrive at least 15 minutes before your scheduled appointment time.   Beginning January 23rd 2017 lab work for the Cancer Center will be done in the  Main lab at [facility] on 1st floor. If you have a lab appointment with the Cancer Center please come in thru the  Main Entrance and check in at the main information desk  You need to re-schedule your appointment should you arrive 10 or more minutes late.  We strive to give you quality time with our providers, and arriving late affects you and other patients whose appointments are after yours.  Also, if you no show three or more times for appointments you may be dismissed from the clinic at the providers discretion.     Again, thank you for choosing [facility].  Our hope is that these requests will decrease the amount of time that you wait before being seen by our physicians.       _____________________________________________________________  Should you have questions after your visit to [facility], please contact our office at [phone] between the hours of 8:30 a.m. and 4:30 p.m.  Voicemails left after 4:30 p.m. will not be returned until the following business day.  For prescription refill requests, have your pharmacy contact our office.         Resources For Cancer Patients and their Caregivers ? American Cancer Society: Can assist with transportation, wigs, general needs, runs Look Good Feel Better.        1-888-227-6333 ? Cancer Care: Provides financial assistance, online support groups,  medication/co-pay assistance.  1-800-813-HOPE (4673) ? [facility] Assists [place] cancer patients and their families through emotional , educational and financial support.  336-427-4357 ? [place] DSS Where to apply for food stamps, Medicaid and utility assistance. 336-342-1394 ? RCATS: Transportation to medical appointments. 336-347-2287 ? Social Security Administration: May apply for disability if have a Stage IV cancer. 336-342-7796 1-800-772-1213 ? [place] Aging, Disability and Transit Services: Assists with nutrition, care and transit needs. 336-349-2343  Cancer Center Support Programs: @10RELATIVEDAYS@ > Cancer Support Group  2nd Tuesday of the month 1pm-2pm, Journey Room  > Creative Journey  3rd Tuesday of the month 1130am-1pm, Journey Room  > Look Good Feel Better  1st Wednesday of the month 10am-12 noon, Journey Room (Call American Cancer Society to register 1-800-395-5775)   

## 2016-01-11 NOTE — Progress Notes (Signed)
Brittany Rowland presented for Portacath access and flush. Portacath located right chest wall accessed with  H 20 needle. Good blood return present. Portacath flushed with 83ml NS and 500U/12ml Heparin and needle removed intact. Procedure without incident. Patient tolerated procedure well.  Vitals stable and discharged ambulatory from clinic.

## 2016-01-15 ENCOUNTER — Encounter (HOSPITAL_COMMUNITY): Payer: Self-pay | Admitting: Hematology & Oncology

## 2016-01-19 ENCOUNTER — Other Ambulatory Visit (HOSPITAL_COMMUNITY): Payer: Self-pay | Admitting: Oncology

## 2016-01-19 DIAGNOSIS — C9110 Chronic lymphocytic leukemia of B-cell type not having achieved remission: Secondary | ICD-10-CM

## 2016-02-22 ENCOUNTER — Encounter (HOSPITAL_COMMUNITY): Payer: BLUE CROSS/BLUE SHIELD

## 2016-02-24 ENCOUNTER — Encounter (HOSPITAL_COMMUNITY): Payer: Self-pay

## 2016-02-24 ENCOUNTER — Encounter (HOSPITAL_COMMUNITY): Payer: BLUE CROSS/BLUE SHIELD | Attending: Oncology

## 2016-02-24 VITALS — BP 156/75 | HR 68 | Temp 98.5°F | Resp 18

## 2016-02-24 DIAGNOSIS — C9112 Chronic lymphocytic leukemia of B-cell type in relapse: Secondary | ICD-10-CM | POA: Diagnosis not present

## 2016-02-24 DIAGNOSIS — Z452 Encounter for adjustment and management of vascular access device: Secondary | ICD-10-CM | POA: Diagnosis not present

## 2016-02-24 DIAGNOSIS — Z95828 Presence of other vascular implants and grafts: Secondary | ICD-10-CM | POA: Insufficient documentation

## 2016-02-24 DIAGNOSIS — C9110 Chronic lymphocytic leukemia of B-cell type not having achieved remission: Secondary | ICD-10-CM | POA: Insufficient documentation

## 2016-02-24 MED ORDER — HEPARIN SOD (PORK) LOCK FLUSH 100 UNIT/ML IV SOLN
500.0000 [IU] | Freq: Once | INTRAVENOUS | Status: AC
  Administered 2016-02-24: 500 [IU] via INTRAVENOUS

## 2016-02-24 MED ORDER — LIDOCAINE-PRILOCAINE 2.5-2.5 % EX CREA: 1.0000 "application " | TOPICAL_CREAM | CUTANEOUS | 1 refills | Status: DC | PRN

## 2016-02-24 MED ORDER — SODIUM CHLORIDE 0.9% FLUSH
10.0000 mL | INTRAVENOUS | Status: DC | PRN
  Administered 2016-02-24: 10 mL via INTRAVENOUS
  Filled 2016-02-24: qty 10

## 2016-02-24 NOTE — Patient Instructions (Signed)
[facility] at [facility] Discharge Instructions  RECOMMENDATIONS MADE BY THE CONSULTANT AND ANY TEST RESULTS WILL BE SENT TO YOUR REFERRING PHYSICIAN.  Port flush today. Return as scheduled for port flushes.  Return as scheduled for office visit.  Thank you for choosing [facility] at [facility] to provide your oncology and hematology care.  To afford each patient quality time with our provider, please arrive at least 15 minutes before your scheduled appointment time.    If you have a lab appointment with the Cancer Center please come in thru the  Main Entrance and check in at the main information desk  You need to re-schedule your appointment should you arrive 10 or more minutes late.  We strive to give you quality time with our providers, and arriving late affects you and other patients whose appointments are after yours.  Also, if you no show three or more times for appointments you may be dismissed from the clinic at the providers discretion.     Again, thank you for choosing [facility].  Our hope is that these requests will decrease the amount of time that you wait before being seen by our physicians.       _____________________________________________________________  Should you have questions after your visit to [facility], please contact our office at [phone] between the hours of 8:30 a.m. and 4:30 p.m.  Voicemails left after 4:30 p.m. will not be returned until the following business day.  For prescription refill requests, have your pharmacy contact our office.       Resources For Cancer Patients and their Caregivers ? American Cancer Society: Can assist with transportation, wigs, general needs, runs Look Good Feel Better.        1-888-227-6333 ? Cancer Care: Provides financial assistance, online support groups, medication/co-pay assistance.  1-800-813-HOPE (4673) ? [facility] Assists [place] cancer patients and their families through emotional , educational and financial support.  336-427-4357 ? [place] DSS Where to apply for food stamps, Medicaid and utility assistance. 336-342-1394 ? RCATS: Transportation to medical appointments. 336-347-2287 ? Social Security Administration: May apply for disability if have a Stage IV cancer. 336-342-7796 1-800-772-1213 ? [place] Aging, Disability and Transit Services: Assists with nutrition, care and transit needs. 336-349-2343  Cancer Center Support Programs: @10RELATIVEDAYS@ > Cancer Support Group  2nd Tuesday of the month 1pm-2pm, Journey Room  > Creative Journey  3rd Tuesday of the month 1130am-1pm, Journey Room  > Look Good Feel Better  1st Wednesday of the month 10am-12 noon, Journey Room (Call American Cancer Society to register 1-800-395-5775)   

## 2016-02-24 NOTE — Progress Notes (Signed)
Brittany Rowland presented for Portacath access and flush.   Portacath located right chest wall accessed with  H 20 needle.  Good blood return present. Portacath flushed with 42ml NS and 500U/27ml Heparin and needle removed intact.  Procedure tolerated well and without incident.

## 2016-02-27 ENCOUNTER — Other Ambulatory Visit (HOSPITAL_COMMUNITY): Payer: Self-pay | Admitting: Oncology

## 2016-02-27 DIAGNOSIS — C9110 Chronic lymphocytic leukemia of B-cell type not having achieved remission: Secondary | ICD-10-CM

## 2016-03-06 ENCOUNTER — Other Ambulatory Visit (HOSPITAL_COMMUNITY): Payer: Self-pay | Admitting: Oncology

## 2016-03-06 DIAGNOSIS — C9110 Chronic lymphocytic leukemia of B-cell type not having achieved remission: Secondary | ICD-10-CM

## 2016-03-06 MED ORDER — IBRUTINIB 140 MG PO CAPS: ORAL_CAPSULE | ORAL | 0 refills | Status: DC

## 2016-03-26 ENCOUNTER — Ambulatory Visit (HOSPITAL_COMMUNITY): Payer: BLUE CROSS/BLUE SHIELD | Admitting: Oncology

## 2016-03-26 ENCOUNTER — Ambulatory Visit (HOSPITAL_COMMUNITY): Payer: BLUE CROSS/BLUE SHIELD | Admitting: Hematology & Oncology

## 2016-03-26 ENCOUNTER — Other Ambulatory Visit (HOSPITAL_COMMUNITY): Payer: BLUE CROSS/BLUE SHIELD

## 2016-03-28 ENCOUNTER — Other Ambulatory Visit (HOSPITAL_COMMUNITY): Payer: Self-pay | Admitting: Oncology

## 2016-03-28 ENCOUNTER — Encounter (HOSPITAL_COMMUNITY): Payer: BLUE CROSS/BLUE SHIELD | Attending: Adult Health

## 2016-03-28 DIAGNOSIS — C9110 Chronic lymphocytic leukemia of B-cell type not having achieved remission: Secondary | ICD-10-CM | POA: Diagnosis present

## 2016-03-28 DIAGNOSIS — Z452 Encounter for adjustment and management of vascular access device: Secondary | ICD-10-CM

## 2016-03-28 DIAGNOSIS — E876 Hypokalemia: Secondary | ICD-10-CM

## 2016-03-28 LAB — CBC WITH DIFFERENTIAL/PLATELET
BASOS PCT: 1 %
Basophils Absolute: 0.1 10*3/uL (ref 0.0–0.1)
EOS PCT: 2 %
Eosinophils Absolute: 0.2 10*3/uL (ref 0.0–0.7)
HCT: 38.1 % (ref 36.0–46.0)
HEMOGLOBIN: 12.9 g/dL (ref 12.0–15.0)
LYMPHS PCT: 81 %
Lymphs Abs: 9.6 10*3/uL — ABNORMAL HIGH (ref 0.7–4.0)
MCH: 28.6 pg (ref 26.0–34.0)
MCHC: 33.9 g/dL (ref 30.0–36.0)
MCV: 84.5 fL (ref 78.0–100.0)
Monocytes Absolute: 0.4 10*3/uL (ref 0.1–1.0)
Monocytes Relative: 3 %
Neutro Abs: 1.5 10*3/uL — ABNORMAL LOW (ref 1.7–7.7)
Neutrophils Relative %: 13 %
Platelets: 271 10*3/uL (ref 150–400)
RBC: 4.51 MIL/uL (ref 3.87–5.11)
RDW: 14.9 % (ref 11.5–15.5)
WBC: 11.8 10*3/uL — ABNORMAL HIGH (ref 4.0–10.5)

## 2016-03-28 LAB — COMPREHENSIVE METABOLIC PANEL
ALK PHOS: 54 U/L (ref 38–126)
ALT: 18 U/L (ref 14–54)
AST: 19 U/L (ref 15–41)
Albumin: 4 g/dL (ref 3.5–5.0)
Anion gap: 8 (ref 5–15)
BUN: 20 mg/dL (ref 6–20)
CALCIUM: 9.2 mg/dL (ref 8.9–10.3)
CO2: 25 mmol/L (ref 22–32)
CREATININE: 0.98 mg/dL (ref 0.44–1.00)
Chloride: 107 mmol/L (ref 101–111)
GFR calc Af Amer: >60 mL/min (ref >60)
GFR calc non Af Amer: 60 mL/min — ABNORMAL LOW (ref >60)
GLUCOSE: 106 mg/dL — AB (ref 65–99)
Potassium: 3.3 mmol/L — ABNORMAL LOW (ref 3.5–5.1)
Sodium: 140 mmol/L (ref 135–145)
Total Bilirubin: 0.6 mg/dL (ref 0.3–1.2)
Total Protein: 6.8 g/dL (ref 6.5–8.1)

## 2016-03-28 LAB — LACTATE DEHYDROGENASE: LDH: 169 U/L (ref 98–192)

## 2016-03-28 MED ORDER — POTASSIUM CHLORIDE CRYS ER 20 MEQ PO TBCR: 40.0000 meq | EXTENDED_RELEASE_TABLET | Freq: Two times a day (BID) | ORAL | 0 refills | Status: DC

## 2016-03-28 MED ORDER — HEPARIN SOD (PORK) LOCK FLUSH 100 UNIT/ML IV SOLN
500.0000 [IU] | Freq: Once | INTRAVENOUS | Status: AC
  Administered 2016-03-28: 500 [IU] via INTRAVENOUS

## 2016-03-28 MED ORDER — SODIUM CHLORIDE 0.9% FLUSH
20.0000 mL | INTRAVENOUS | Status: DC | PRN
  Administered 2016-03-28: 20 mL via INTRAVENOUS
  Filled 2016-03-28: qty 20

## 2016-03-28 NOTE — Patient Instructions (Signed)
[place] at [facility] Discharge Instructions  RECOMMENDATIONS MADE BY THE CONSULTANT AND ANY TEST RESULTS WILL BE SENT TO YOUR REFERRING PHYSICIAN.  Labs drawn from port then flushed per protocol. Follow-up as scheduled. Call clinic for any questions or concerns  Thank you for choosing [place] at [facility] to provide your oncology and hematology care.  To afford each patient quality time with our provider, please arrive at least 15 minutes before your scheduled appointment time.    If you have a lab appointment with the [facility] please come in thru the  Main Entrance and check in at the main information desk  You need to re-schedule your appointment should you arrive 10 or more minutes late.  We strive to give you quality time with our providers, and arriving late affects you and other patients whose appointments are after yours.  Also, if you no show three or more times for appointments you may be dismissed from the clinic at the providers discretion.     Again, thank you for choosing [facility].  Our hope is that these requests will decrease the amount of time that you wait before being seen by our physicians.       _____________________________________________________________  Should you have questions after your visit to [facility], please contact our office at [phone] between the hours of 8:30 a.m. and 4:30 p.m.  Voicemails left after 4:30 p.m. will not be returned until the following business day.  For prescription refill requests, have your pharmacy contact our office.       Resources For Cancer Patients and their Caregivers ? American Cancer Society: Can assist with transportation, wigs, general needs, runs Look Good Feel Better.        ([phone] ? Cancer Care: Provides financial assistance, online support groups, medication/co-pay assistance.  1-800-813-HOPE 510-002-2255) ? [place] Assists [place] cancer patients and their families through emotional , educational and financial support.  801-687-3294 ? [place] DSS Where to apply for food stamps, Medicaid and utility assistance. ([phone] ? RCATS: Transportation to medical appointments. 985-294-9660 ? Social Security Administration: May apply for disability if have a Stage IV cancer. ([phone] 918-005-6851 ? [employer], Disability and Transit Services: Assists with nutrition, care and transit needs. [place] Support Programs: @10RELATIVEDAYS @ > Cancer Support Group  2nd Tuesday of the month 1pm-2pm, Journey Room  > Creative Journey  3rd Tuesday of the month 1130am-1pm, Journey Room  > Look Good Feel Better  1st Wednesday of the month 10am-12 noon, Journey Room (Call [place] to register [phone])

## 2016-03-28 NOTE — Progress Notes (Signed)
Brittany Rowland tolerated port lab draw with flush well without complaints or incident. Port accessed with 20 gauge needle and blood drawn for labs ordered then port flushed with 20 ml NS and 5 ml Heparin easily per protocol. Pt forgot to take her B/P pill this am so her B/P is slightly elevated. Pt will take it when she gets home. Pt discharged self ambulatory in satisfactory condition

## 2016-05-01 NOTE — Assessment & Plan Note (Deleted)
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  Initially treated with BR by Dr. [name] at [facility].  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  Oncology history is developed.  Port flush and labs today: CBC diff, CMET, LDH.  I personally reviewed and went over laboratory results with the patient.  The results are noted within this dictation.  Labs in ~ 12 weeks: CBC diff, CMET, LDH.  Port flushes every 6-8 weeks.  Compliance is encouraged with Imbruvica.  Compliance is questionable.  Last refill of Imbruvica was written on 03/08/2016 (one month supply).  Return in approximately 12 weeks for follow-up.

## 2016-05-01 NOTE — Progress Notes (Signed)
-  Patient rescheduled appt-  ROS

## 2016-05-02 ENCOUNTER — Ambulatory Visit (HOSPITAL_COMMUNITY): Payer: BLUE CROSS/BLUE SHIELD | Admitting: Oncology

## 2016-05-09 ENCOUNTER — Other Ambulatory Visit (HOSPITAL_COMMUNITY): Payer: Self-pay | Admitting: Oncology

## 2016-05-09 DIAGNOSIS — C9110 Chronic lymphocytic leukemia of B-cell type not having achieved remission: Secondary | ICD-10-CM

## 2016-05-09 MED ORDER — IBRUTINIB 140 MG PO TABS: 420.0000 mg | ORAL_TABLET | Freq: Every day | ORAL | 0 refills | Status: DC

## 2016-05-11 ENCOUNTER — Other Ambulatory Visit (HOSPITAL_COMMUNITY): Payer: Self-pay | Admitting: Oncology

## 2016-05-11 DIAGNOSIS — C9110 Chronic lymphocytic leukemia of B-cell type not having achieved remission: Secondary | ICD-10-CM

## 2016-05-11 MED ORDER — IBRUTINIB 420 MG PO TABS: 420.0000 mg | ORAL_TABLET | Freq: Every day | ORAL | 0 refills | Status: DC

## 2016-05-23 ENCOUNTER — Other Ambulatory Visit (HOSPITAL_COMMUNITY): Payer: Self-pay | Admitting: Oncology

## 2016-05-23 ENCOUNTER — Encounter (HOSPITAL_COMMUNITY): Payer: BLUE CROSS/BLUE SHIELD | Attending: Oncology

## 2016-05-23 VITALS — BP 172/81 | HR 76 | Temp 98.2°F | Resp 20

## 2016-05-23 DIAGNOSIS — Z452 Encounter for adjustment and management of vascular access device: Secondary | ICD-10-CM

## 2016-05-23 DIAGNOSIS — E876 Hypokalemia: Secondary | ICD-10-CM

## 2016-05-23 DIAGNOSIS — Z95828 Presence of other vascular implants and grafts: Secondary | ICD-10-CM | POA: Diagnosis present

## 2016-05-23 DIAGNOSIS — C9110 Chronic lymphocytic leukemia of B-cell type not having achieved remission: Secondary | ICD-10-CM | POA: Diagnosis not present

## 2016-05-23 LAB — CBC WITH DIFFERENTIAL/PLATELET
BASOS PCT: 0 %
Basophils Absolute: 0 10*3/uL (ref 0.0–0.1)
Eosinophils Absolute: 0.3 10*3/uL (ref 0.0–0.7)
Eosinophils Relative: 2 %
HCT: 37.2 % (ref 36.0–46.0)
HEMOGLOBIN: 12.5 g/dL (ref 12.0–15.0)
LYMPHS ABS: 9.6 10*3/uL — AB (ref 0.7–4.0)
Lymphocytes Relative: 74 %
MCH: 28.6 pg (ref 26.0–34.0)
MCHC: 33.6 g/dL (ref 30.0–36.0)
MCV: 85.1 fL (ref 78.0–100.0)
MONOS PCT: 4 %
Monocytes Absolute: 0.5 10*3/uL (ref 0.1–1.0)
NEUTROS ABS: 2.6 10*3/uL (ref 1.7–7.7)
NEUTROS PCT: 20 %
Platelets: 236 10*3/uL (ref 150–400)
RBC: 4.37 MIL/uL (ref 3.87–5.11)
RDW: 14.6 % (ref 11.5–15.5)
WBC: 13 10*3/uL — ABNORMAL HIGH (ref 4.0–10.5)

## 2016-05-23 LAB — COMPREHENSIVE METABOLIC PANEL
ALK PHOS: 48 U/L (ref 38–126)
ALT: 14 U/L (ref 14–54)
AST: 17 U/L (ref 15–41)
Albumin: 4 g/dL (ref 3.5–5.0)
Anion gap: 10 (ref 5–15)
BUN: 15 mg/dL (ref 6–20)
CALCIUM: 9.2 mg/dL (ref 8.9–10.3)
CO2: 25 mmol/L (ref 22–32)
Chloride: 106 mmol/L (ref 101–111)
Creatinine, Ser: 0.82 mg/dL (ref 0.44–1.00)
GFR calc Af Amer: >60 mL/min (ref >60)
GFR calc non Af Amer: >60 mL/min (ref >60)
GLUCOSE: 96 mg/dL (ref 65–99)
Potassium: 2.9 mmol/L — ABNORMAL LOW (ref 3.5–5.1)
SODIUM: 141 mmol/L (ref 135–145)
Total Bilirubin: 0.5 mg/dL (ref 0.3–1.2)
Total Protein: 7 g/dL (ref 6.5–8.1)

## 2016-05-23 LAB — LACTATE DEHYDROGENASE: LDH: 165 U/L (ref 98–192)

## 2016-05-23 MED ORDER — SODIUM CHLORIDE 0.9% FLUSH: 10.0000 mL | INTRAVENOUS | Status: DC | PRN

## 2016-05-23 MED ORDER — HEPARIN SOD (PORK) LOCK FLUSH 100 UNIT/ML IV SOLN
500.0000 [IU] | Freq: Once | INTRAVENOUS | Status: AC
  Administered 2016-05-23: 500 [IU] via INTRAVENOUS
  Filled 2016-05-23: qty 5

## 2016-05-23 NOTE — Progress Notes (Signed)
Taking Imbruvica every day but has missed 1 dose of Imbruvica. Pt does state that she is noticing that she bruises more easily. Pt has dry nails, cuticles, and palm of hands are dry. Pt instructed to purchase Cerave, Eucerin, or Udder Cream and apply to hands and nails twice a day. Pt instructed to put a clear coat on her nails since her nails are dry and breaking off "at the meat". This is causing pain in her nail area.  S [place] presented for Portacath access and flush. Portacath located rt chest wall accessed with  H 20 needle. Good blood return present. Portacath flushed with 66ml NS and 500U/22ml Heparin and needle removed intact. Procedure without incident. Patient tolerated procedure well.

## 2016-05-23 NOTE — Progress Notes (Signed)
Potassium 2.9. Patient instructed to take potassium (33mdq) 2 tablets now and 2 tablets in 4 hours per Dr. Talbert Cage. Patient instructed to make sure and take her potassium as ordered. Patient said ok. She has been missing some doses of potassium. I also instructed her per Dr. Talbert Cage to eat some bananas. Dr. Talbert Cage wants her following up with her PCP regarding her potassium level since it has been staying low. She said ok to these instructions.

## 2016-05-23 NOTE — Patient Instructions (Signed)
[place] at [facility] Discharge Instructions  RECOMMENDATIONS MADE BY THE CONSULTANT AND ANY TEST RESULTS WILL BE SENT TO YOUR REFERRING PHYSICIAN.  Please purchase some Cerave lotion/cream, Eucerin lotion/cream, or Udder cream and apply twice a day or more to the hands/feet and nail beds. You can also use cuticle oil if you need or want to. You may also get some clear coat nail polish and apply to your nails to help add a layer of protection to them. Keep your nails cut short/filed. Please let us know if these interventions don't help decrease nail breakage/pain. If your hands get more dry or begin to peel - we need to know!!!! If you start having nails splitting in two - we need to know!! Your Imbruvica dosage may need to be stopped and dose reduced. Please call [phone] if you have any problems or questions.  Thank you for choosing [place] at [facility] to provide your oncology and hematology care.  To afford each patient quality time with our provider, please arrive at least 15 minutes before your scheduled appointment time.    If you have a lab appointment with the [facility] please come in thru the  Main Entrance and check in at the main information desk  You need to re-schedule your appointment should you arrive 10 or more minutes late.  We strive to give you quality time with our providers, and arriving late affects you and other patients whose appointments are after yours.  Also, if you no show three or more times for appointments you may be dismissed from the clinic at the providers discretion.     Again, thank you for choosing [facility].  Our hope is that these requests will decrease the amount of time that you wait before being seen by our physicians.       _____________________________________________________________  Should you have questions after your visit to [facility], please contact our office  at [phone] between the hours of 8:30 a.m. and 4:30 p.m.  Voicemails left after 4:30 p.m. will not be returned until the following business day.  For prescription refill requests, have your pharmacy contact our office.       Resources For Cancer Patients and their Caregivers ? American Cancer Society: Can assist with transportation, wigs, general needs, runs Look Good Feel Better.        402-127-4589 ? Cancer Care: Provides financial assistance, online support groups, medication/co-pay assistance.  1-800-813-HOPE 760-204-6634) ? [place] Assists [place] cancer patients and their families through emotional , educational and financial support.  810-240-8676 ? [place] DSS Where to apply for food stamps, Medicaid and utility assistance. 848-381-8993 ? RCATS: Transportation to medical appointments. (519)720-9025 ? Social Security Administration: May apply for disability if have a Stage IV cancer. 551-018-9289 [phone] ? [employer], Disability and Transit Services: Assists with nutrition, care and transit needs. [place] Support Programs: @10RELATIVEDAYS @ > Cancer Support Group  2nd Tuesday of the month 1pm-2pm, Journey Room  > Creative Journey  3rd Tuesday of the month 1130am-1pm, Journey Room  > Look Good Feel Better  1st Wednesday of the month 10am-12 noon, Journey Room (Call [place] to register [phone])

## 2016-05-24 ENCOUNTER — Encounter (HOSPITAL_COMMUNITY): Payer: BLUE CROSS/BLUE SHIELD

## 2016-06-06 ENCOUNTER — Encounter (HOSPITAL_COMMUNITY): Payer: Self-pay | Admitting: Oncology

## 2016-06-06 ENCOUNTER — Encounter (HOSPITAL_BASED_OUTPATIENT_CLINIC_OR_DEPARTMENT_OTHER): Payer: BLUE CROSS/BLUE SHIELD | Admitting: Oncology

## 2016-06-06 VITALS — BP 138/68 | HR 66 | Temp 98.0°F | Resp 16 | Ht 64.0 in | Wt 166.7 lb

## 2016-06-06 DIAGNOSIS — F1020 Alcohol dependence, uncomplicated: Secondary | ICD-10-CM | POA: Diagnosis not present

## 2016-06-06 DIAGNOSIS — R110 Nausea: Secondary | ICD-10-CM | POA: Diagnosis not present

## 2016-06-06 DIAGNOSIS — Z95828 Presence of other vascular implants and grafts: Secondary | ICD-10-CM

## 2016-06-06 DIAGNOSIS — Z720 Tobacco use: Secondary | ICD-10-CM

## 2016-06-06 DIAGNOSIS — E876 Hypokalemia: Secondary | ICD-10-CM | POA: Diagnosis not present

## 2016-06-06 DIAGNOSIS — R1115 Cyclical vomiting syndrome unrelated to migraine: Secondary | ICD-10-CM

## 2016-06-06 DIAGNOSIS — G4700 Insomnia, unspecified: Secondary | ICD-10-CM | POA: Diagnosis not present

## 2016-06-06 DIAGNOSIS — C9110 Chronic lymphocytic leukemia of B-cell type not having achieved remission: Secondary | ICD-10-CM | POA: Diagnosis not present

## 2016-06-06 DIAGNOSIS — S40261A Insect bite (nonvenomous) of right shoulder, initial encounter: Secondary | ICD-10-CM | POA: Diagnosis not present

## 2016-06-06 DIAGNOSIS — D649 Anemia, unspecified: Secondary | ICD-10-CM | POA: Diagnosis not present

## 2016-06-06 DIAGNOSIS — W57XXXA Bitten or stung by nonvenomous insect and other nonvenomous arthropods, initial encounter: Secondary | ICD-10-CM

## 2016-06-06 DIAGNOSIS — D6489 Other specified anemias: Secondary | ICD-10-CM

## 2016-06-06 MED ORDER — ONDANSETRON HCL 8 MG PO TABS: 8.0000 mg | ORAL_TABLET | Freq: Three times a day (TID) | ORAL | 3 refills | Status: DC | PRN

## 2016-06-06 MED ORDER — POTASSIUM CHLORIDE CRYS ER 20 MEQ PO TBCR: 40.0000 meq | EXTENDED_RELEASE_TABLET | Freq: Two times a day (BID) | ORAL | 0 refills | Status: DC

## 2016-06-06 MED ORDER — PROCHLORPERAZINE MALEATE 10 MG PO TABS: 10.0000 mg | ORAL_TABLET | Freq: Four times a day (QID) | ORAL | 3 refills | Status: DC | PRN

## 2016-06-06 NOTE — Progress Notes (Signed)
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  INTERVAL HISTORY: Brittany Rowland 66 y.o. female returns for followup of CLL, 13 q, on Imbruvica 420 mg daily with questionable compliance.  Initially treated with BR by Dr. [name] at [facility].  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    CLL (chronic lymphocytic leukemia) ([place])   12/16/2011 Initial Diagnosis    CLL (chronic lymphocytic leukemia), stage A ([place]), stage 0 (Rai)      01/13/2013 Pathology Results    Monoclonal B cell population is detected with kapp light chain restriction, representing 82% of peripheral leukocytes. B cell population expresses CD19, CD20, CD11C, CD23, and aberrant CD5. No blasts.      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        03/30/2014 Cancer Staging    CLL Stage B (Binet), Stage I (Rai), now with doubling of WBC and approximate 6 months and widespread adenopathy, dropping hemoglobin, but not under 11 g/dL yet, fatigue and weakness      05/31/2014 Cancer Staging    Stage III by both Binet and Rai staging systems, HGB 10.6 g/dL.      07/07/2014 Imaging    CT CAP- extensive adenopathy w/in low neck, chest, abdomen, and pelvis.  Centrilobular emphysema. Nonspecific 5 mm LLL pulmonary nodule. Nonspecific focal left-sided pleural nodularity. 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      08/02/2014 - 11/02/2014 Chemotherapy    Bendamustine/Rituxan x 4 cycles with normalization of HGB and WBC and PLT count with disappearance of all symptoms and lymph nodes      09/07/2014 Adverse Reaction    Hospitalization with fever and chills, acute bronchitis, negative blood cultures. Comlicated by Levaquin skin rash.  Bendamustine dose reduced for next cycle.      09/08/2014 Echocardiogram    Left ventricular wall motion and contractility are within normal limits.  The estimated ejection fraction is greater than 65%.  Normal left ventricular diastolic filling is observed.      12/21/2014 Treatment Plan Change    Chemotherapy discontinued following 4 cycles of therapy due to tremendouns response and disappearance of all symptoms and lymph nodes.      12/21/2014 Remission         06/22/2015 Relapse/Recurrence    Per Dr. [name]: "Now relapsing changes in her WBC count, lymphocyte count, LDH level. Before she gets symptomatic again, I think is worthy of starting her on Ibrutinib while she has a great performance status..."      06/29/2015 -  Chemotherapy    Ibrutinib initiated       She claims compliance with her Imbruvica.  She has a number of chronic complaints which predated her transfer of care to [facility].  She reports fatigue and dizziness.  She also notes easy bruisability.  She denies any bleeding.  She continues to have issues with insomnia of which, she blames on menopause.  She does report nausea and vomiting  which is chronic and stable.  She denies any worsening of her nausea and vomiting.  She notes that typically is in the morning.  She does not have nausea medicine at home.  Weight is stable and actually improved compared to last year.  Notes that her nails are brittle and breaking easily.  She reports a history of having nails that were long and strong and remembers receiving multiple complements about her nails in the past.  She is currently utilizing  some nail strengthening lotion.  She reports being bit by a tick on her right shoulder after her husband was doing yard work and laid down in the bed following yardwork without changing or bathing.  She noted a tick on her right shoulder.  This was removed at home.   She notes that it is now pruritic.  No discharge noted.  On exam there is some remnants left from tick.  She reports an appetite of 100%.  Energy of 50%.  She denies any new pain.   Review of Systems  Constitutional: Positive for malaise/fatigue. Negative for chills, fever and weight loss.  HENT: Negative.   Eyes: Negative.   Respiratory: Negative.  Negative for cough.   Cardiovascular: Negative.  Negative for chest pain.  Gastrointestinal: Positive for nausea and vomiting. Negative for blood in stool, constipation, diarrhea and melena.  Genitourinary: Negative.   Musculoskeletal: Negative.   Skin: Negative.   Neurological: Positive for dizziness. Negative for weakness.  Endo/Heme/Allergies: Bruises/bleeds easily.  Psychiatric/Behavioral: The patient has insomnia.     Past Medical History:  Diagnosis Date  . Alcohol dependence, continuous ([place]) 05/15/2004   Overview:  ICD10 Conversion  . Anemia   . 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 CLL (chronic lymphocytic leukemia) ([place])   . Depression   . Diabetes mellitus without complication ([place])   . Hypertension   . Hypokalemia   . Leukemia ([place])   . Tobacco abuse 12/21/2011   Quit in July 2016    History reviewed. No pertinent surgical history.  Family History  Problem Relation Age of Onset  . Heart failure Mother   . Diabetes Mother   . Hypertension Mother   . Heart failure Father     Social History   Social History  . Marital status: Married    Spouse name: N/A  . Number of children: N/A  . Years of education: N/A   Social History Main Topics  . Smoking status: Former Smoker    Packs/day: 1.00    Years: 45.00    Types: Cigarettes    Quit  date: 09/15/2014  . Smokeless tobacco: Former Systems developer  . Alcohol use 1.2 oz/week    2 Shots of liquor per week  . Drug use: No  . Sexual activity: Not Asked   Other Topics Concern  . None   Social History Narrative  . None     PHYSICAL EXAMINATION  ECOG PERFORMANCE STATUS: 1 - Symptomatic but completely ambulatory  Vitals:   06/06/16 1005  BP: 138/68  Pulse: 66  Resp: 16  Temp: 98 F (36.7 C)    GENERAL:alert, no distress, well nourished, well developed, comfortable, cooperative and blunted affect, fatigue, unaccompanied. SKIN: skin color, texture, turgor are normal, positive for: right shoulder healed lesion with surrounding erythema without discharge.  On illumination, small thin item resembling a leg of tick remains.   HEAD: Normocephalic, No masses, lesions, tenderness or abnormalities EYES: normal, EOMI, Conjunctiva are pink and non-injected EARS: External ears normal  OROPHARYNX:lips, buccal mucosa, and tongue normal and mucous membranes are moist  NECK: supple, trachea midline LYMPH:  no palpable lymphadenopathy BREAST:not examined LUNGS: clear to auscultation and percussion HEART: regular rate & rhythm, no murmurs, no gallops, S1 normal and S2 normal ABDOMEN:abdomen soft, non-tender, obese and normal bowel sounds BACK: Back symmetric, no curvature. EXTREMITIES:less then 2 second capillary refill, no joint deformities, effusion, or inflammation, no skin discoloration, no cyanosis  NEURO: alert & oriented x 3 with fluent speech, no focal motor/sensory deficits, gait normal PROCEDURE NOTE:   Right shoulder lesion is cleaned and prepped.  Using a 30 g needle and 5 mL syringe, scab is removed and tick remnant is removed using forceps.  Recheck with illumination for residual tick demonstrates no more material needing removed.  Band-Aid with bacitracin is used to cover open area.   LABORATORY DATA: CBC    Component Value Date/Time   WBC 13.0 (H) 05/23/2016 1100   RBC  4.37 05/23/2016 1100   HGB 12.5 05/23/2016 1100   HCT 37.2 05/23/2016 1100   PLT 236 05/23/2016 1100   MCV 85.1 05/23/2016 1100   MCH 28.6 05/23/2016 1100   MCHC 33.6 05/23/2016 1100   RDW 14.6 05/23/2016 1100   LYMPHSABS 9.6 (H) 05/23/2016 1100   MONOABS 0.5 05/23/2016 1100   EOSABS 0.3 05/23/2016 1100   BASOSABS 0.0 05/23/2016 1100      Chemistry      Component Value Date/Time   NA 141 05/23/2016 1100   K 2.9 (L) 05/23/2016 1100   CL 106 05/23/2016 1100   CO2 25 05/23/2016 1100   BUN 15 05/23/2016 1100   CREATININE 0.82 05/23/2016 1100      Component Value Date/Time   CALCIUM 9.2 05/23/2016 1100   ALKPHOS 48 05/23/2016 1100   AST 17 05/23/2016 1100   ALT 14 05/23/2016 1100   BILITOT 0.5 05/23/2016 1100        PENDING LABS:   RADIOGRAPHIC STUDIES:  No results found.   PATHOLOGY:    ASSESSMENT AND PLAN:  CLL (chronic lymphocytic leukemia) (HCC) CLL, 13 q, on Imbruvica 420 mg daily with questionable compliance.  Initially treated with BR by Dr. [name] at [facility].  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  Oncology history is developed.  Port flush and labs from 05/23/2016: CBC diff, CMET, LDH.  I personally reviewed and went over laboratory results with the patient.  The results are noted within this dictation.  Hypokalemia noted and addressed ion 05/23/2016.  Labs in ~ 12 weeks: CBC diff, CMET, LDH.  Port flushes every 6-8 weeks.  Compliance is encouraged with Imbruvica.  Compliance is questionable.  Last refill of Imbruvica was written on 03/08/2016 (one month supply).  For her chronic nausea, I have escribed Zofran 8 mg and Compazine 10 mg.  Nausea is not a typical side effect of Imbruvica and therefore, will need to rule out other etiologies if persistent or resistant to anti-emetics.  She is advised to follow-up with her primary care provider about her nail changes.  She is currently  using a conditioning agent.  This is not Imbruvica induced.  She had residual tick remnant at her tick bite site on the right shoulder.  This remnant is removed in clinic today after prepping the skin with a 30g needle to remove scab and forceps used to remove extremity of tick.  Bacitracin is placed over open area with Band-Aid.  Problem list reviewed with patient and edited accordingly.  Medications are reviewed with the patient and edited accordingly.  Return in approximately 12 weeks for follow-up.  More than 50% of the time spent with the patient was utilized for counseling and coordination of care.  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  Initially treated with BR by Dr. [name] at [facility].  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  Oncology history is up to date.  Port flush and labs today: CBC diff, CMET, LDH.  I personally reviewed and went over laboratory results with the patient.  The results are noted within this dictation.  Labs in ~ 12 weeks: CBC diff, CMET, LDH.  Port flushes every 6-8 weeks.  Compliance is encouraged with Imbruvica.  Compliance is questionable.  Last refill of Imbruvica was written on 05/11/2016 (one month supply).  Return in approximately 12 weeks for follow-up.  Hypokalemia Hypokalemia, secondary to HCTZ-containing antihypertensive.  Addressed on 05/23/2016 with Kdur 40 mEq daily.    We have recommended follow-up with PCP for ongoing management and follow-up of hypokalemia.  She refused K+ and magnesium check today.  I have refilled her K+ today as well.  This is escribed to her pharmacy.  Port-a-cath in place Port a cath in place from previous chemotherapy.  Port flushes are ordered every 6-8  weeks.   ORDERS PLACED FOR THIS ENCOUNTER: No orders of the defined types were placed in this encounter.   MEDICATIONS PRESCRIBED THIS ENCOUNTER: Meds ordered this encounter  Medications  . atorvastatin (LIPITOR) 20 MG tablet  . DISCONTD: IMBRUVICA 140 MG tablet  . potassium chloride SA (K-DUR,KLOR-CON) 20 MEQ tablet    Sig: Take 2 tablets (40 mEq total) by mouth 2 (two) times daily.    Dispense:  30 tablet    Refill:  0    Future refills from primary care provider if needed    Order Specific Question:   Supervising Provider    Answer:   Brunetta Genera [[id number]]  . ondansetron (ZOFRAN) 8 MG tablet    Sig: Take 1 tablet (8 mg total) by mouth every 8 (eight) hours as needed for nausea or vomiting.    Dispense:  30 tablet    Refill:  3    Order Specific Question:   Supervising Provider    Answer:   Brunetta Genera [[id number]]  . prochlorperazine (COMPAZINE) 10 MG tablet    Sig: Take 1 tablet (10 mg total) by mouth every 6 (six) hours as needed for nausea or vomiting.    Dispense:  30 tablet    Refill:  3    Order Specific Question:   Supervising Provider    Answer:   Brunetta Genera [[id number]]    THERAPY PLAN:  Continue Imbruvica 420 mg daily; compliance encouraged.  All questions were answered. The patient knows to call the clinic with any problems, questions or concerns. We can certainly see the patient much sooner if necessary.  Patient and plan discussed with Dr. Twana First and she is in agreement with the aforementioned.   This note is electronically signed by: Doy Mince 06/06/2016 1:15 PM

## 2016-06-06 NOTE — Patient Instructions (Signed)
[place] at [facility] Discharge Instructions  RECOMMENDATIONS MADE BY THE CONSULTANT AND ANY TEST RESULTS WILL BE SENT TO YOUR REFERRING PHYSICIAN.  You were seen today by Manning Charity Follow up in 3 months with labs Call your primary care physician to follow up on your low potassium We have called in prescriptions for potassium, Zofran and Compazine See Amy up front for appointments   Thank you for choosing [place] at [facility] to provide your oncology and hematology care.  To afford each patient quality time with our provider, please arrive at least 15 minutes before your scheduled appointment time.    If you have a lab appointment with the [place] please come in thru the  Main Entrance and check in at the main information desk  You need to re-schedule your appointment should you arrive 10 or more minutes late.  We strive to give you quality time with our providers, and arriving late affects you and other patients whose appointments are after yours.  Also, if you no show three or more times for appointments you may be dismissed from the clinic at the providers discretion.     Again, thank you for choosing [facility].  Our hope is that these requests will decrease the amount of time that you wait before being seen by our physicians.       _____________________________________________________________  Should you have questions after your visit to [facility], please contact our office at [phone] between the hours of 8:30 a.m. and 4:30 p.m.  Voicemails left after 4:30 p.m. will not be returned until the following business day.  For prescription refill requests, have your pharmacy contact our office.       Resources For Cancer Patients and their Caregivers ? American Cancer Society: Can assist with transportation, wigs, general needs, runs Look Good Feel Better.         825-606-1620 ? Cancer Care: Provides financial assistance, online support groups, medication/co-pay assistance.  1-800-813-HOPE 539-812-0541) ? [facility] Assists [place] cancer patients and their families through emotional , educational and financial support.  4787762824 ? [place] DSS Where to apply for food stamps, Medicaid and utility assistance. 854-078-5925 ? RCATS: Transportation to medical appointments. 437-846-4542 ? Social Security Administration: May apply for disability if have a Stage IV cancer. [phone] ? [employer], Disability and Transit Services: Assists with nutrition, care and transit needs. [place] Support Programs: @10RELATIVEDAYS @ > Cancer Support Group  2nd Tuesday of the month 1pm-2pm, Journey Room  > Creative Journey  3rd Tuesday of the month 1130am-1pm, Journey Room  > Look Good Feel Better  1st Wednesday of the month 10am-12 noon, Journey Room (Call [place] to register [phone])

## 2016-06-06 NOTE — Assessment & Plan Note (Signed)
Tobacco abuse, cessation encouraged

## 2016-06-06 NOTE — Progress Notes (Signed)
Patient is taking Ibrutinib.  She reports taking it everyday except one.  She states that she has nausea at times but it is not getting any worse. She takes medication to control the nausea.  She also reports being fatigued since taking medication but also states that it is no worse now than when she began taking it.  Provider aware of symptoms.

## 2016-06-06 NOTE — Assessment & Plan Note (Signed)
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  Initially treated with BR by Dr. [name] at [facility].  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  Oncology history is up to date.  Port flush and labs today: CBC diff, CMET, LDH.  I personally reviewed and went over laboratory results with the patient.  The results are noted within this dictation.  Labs in ~ 12 weeks: CBC diff, CMET, LDH.  Port flushes every 6-8 weeks.  Compliance is encouraged with Imbruvica.  Compliance is questionable.  Last refill of Imbruvica was written on 05/11/2016 (one month supply).  Return in approximately 12 weeks for follow-up.

## 2016-06-06 NOTE — Assessment & Plan Note (Signed)
Port a cath in place from previous chemotherapy.  Port flushes are ordered every 6-8 weeks.

## 2016-06-06 NOTE — Assessment & Plan Note (Addendum)
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  Initially treated with BR by Dr. [name] at [facility].  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  Oncology history is developed.  Port flush and labs from 05/23/2016: CBC diff, CMET, LDH.  I personally reviewed and went over laboratory results with the patient.  The results are noted within this dictation.  Hypokalemia noted and addressed ion 05/23/2016.  Labs in ~ 12 weeks: CBC diff, CMET, LDH.  Port flushes every 6-8 weeks.  Compliance is encouraged with Imbruvica.  Compliance is questionable.  Last refill of Imbruvica was written on 03/08/2016 (one month supply).  For her chronic nausea, I have escribed Zofran 8 mg and Compazine 10 mg.  Nausea is not a typical side effect of Imbruvica and therefore, will need to rule out other etiologies if persistent or resistant to anti-emetics.  She is advised to follow-up with her primary care provider about her nail changes.  She is currently using a conditioning agent.  This is not Imbruvica induced.  She had residual tick remnant at her tick bite site on the right shoulder.  This remnant is removed in clinic today after prepping the skin with a 30g needle to remove scab and forceps used to remove extremity of tick.  Bacitracin is placed over open area with Band-Aid.  Problem list reviewed with patient and edited accordingly.  Medications are reviewed with the patient and edited accordingly.  Return in approximately 12 weeks for follow-up.  More than 50% of the time spent with the patient was utilized for counseling and coordination of care.

## 2016-06-06 NOTE — Assessment & Plan Note (Signed)
Hypokalemia, secondary to HCTZ-containing antihypertensive.  Addressed on 05/23/2016 with Kdur 40 mEq daily.    We have recommended follow-up with PCP for ongoing management and follow-up of hypokalemia.  She refused K+ and magnesium check today.  I have refilled her K+ today as well.  This is escribed to her pharmacy.

## 2016-06-06 NOTE — Assessment & Plan Note (Signed)
EtOH cessation encouraged

## 2016-06-09 ENCOUNTER — Other Ambulatory Visit (HOSPITAL_COMMUNITY): Payer: Self-pay | Admitting: Oncology

## 2016-06-09 DIAGNOSIS — C9110 Chronic lymphocytic leukemia of B-cell type not having achieved remission: Secondary | ICD-10-CM

## 2016-07-11 ENCOUNTER — Other Ambulatory Visit (HOSPITAL_COMMUNITY): Payer: Self-pay | Admitting: Oncology

## 2016-07-11 DIAGNOSIS — C9110 Chronic lymphocytic leukemia of B-cell type not having achieved remission: Secondary | ICD-10-CM

## 2016-07-18 ENCOUNTER — Encounter (HOSPITAL_COMMUNITY): Payer: BLUE CROSS/BLUE SHIELD | Attending: Oncology

## 2016-07-18 ENCOUNTER — Encounter (HOSPITAL_COMMUNITY): Payer: Self-pay

## 2016-07-18 VITALS — BP 186/79 | HR 68 | Temp 98.3°F | Resp 18

## 2016-07-18 DIAGNOSIS — C9110 Chronic lymphocytic leukemia of B-cell type not having achieved remission: Secondary | ICD-10-CM

## 2016-07-18 DIAGNOSIS — Z452 Encounter for adjustment and management of vascular access device: Secondary | ICD-10-CM

## 2016-07-18 DIAGNOSIS — Z95828 Presence of other vascular implants and grafts: Secondary | ICD-10-CM | POA: Insufficient documentation

## 2016-07-18 MED ORDER — SODIUM CHLORIDE 0.9% FLUSH
10.0000 mL | INTRAVENOUS | Status: DC | PRN
  Administered 2016-07-18: 10 mL via INTRAVENOUS
  Filled 2016-07-18: qty 10

## 2016-07-18 MED ORDER — HEPARIN SOD (PORK) LOCK FLUSH 100 UNIT/ML IV SOLN
500.0000 [IU] | Freq: Once | INTRAVENOUS | Status: AC
  Administered 2016-07-18: 500 [IU] via INTRAVENOUS

## 2016-07-18 MED ORDER — HEPARIN SOD (PORK) LOCK FLUSH 100 UNIT/ML IV SOLN
INTRAVENOUS | Status: AC
  Filled 2016-07-18: qty 5

## 2016-07-18 NOTE — Patient Instructions (Signed)
[facility] at [facility] Discharge Instructions  RECOMMENDATIONS MADE BY THE CONSULTANT AND ANY TEST RESULTS WILL BE SENT TO YOUR REFERRING PHYSICIAN.  Portacath flushed per protocol today. Follow-up as scheduled. Call clinic for any questions or concerns  Thank you for choosing  [facility] at [facility] to provide your oncology and hematology care.  To afford each patient quality time with our provider, please arrive at least 15 minutes before your scheduled appointment time.    If you have a lab appointment with the Cancer Center please come in thru the  Main Entrance and check in at the main information desk  You need to re-schedule your appointment should you arrive 10 or more minutes late.  We strive to give you quality time with our providers, and arriving late affects you and other patients whose appointments are after yours.  Also, if you no show three or more times for appointments you may be dismissed from the clinic at the providers discretion.     Again, thank you for choosing [facility].  Our hope is that these requests will decrease the amount of time that you wait before being seen by our physicians.       _____________________________________________________________  Should you have questions after your visit to  Cancer Center, please contact our office at [phone] between the hours of 8:30 a.m. and 4:30 p.m.  Voicemails left after 4:30 p.m. will not be returned until the following business day.  For prescription refill requests, have your pharmacy contact our office.       Resources For Cancer Patients and their Caregivers ? American Cancer Society: Can assist with transportation, wigs, general needs, runs Look Good Feel Better.        1-888-227-6333 ? Cancer Care: Provides financial assistance, online support groups, medication/co-pay assistance.  1-800-813-HOPE (4673) ? [facility] Assists [place] cancer patients and their families through emotional , educational and financial support.  336-427-4357 ? [place] DSS Where to apply for food stamps, Medicaid and utility assistance. 336-342-1394 ? RCATS: Transportation to medical appointments. 336-347-2287 ? Social Security Administration: May apply for disability if have a Stage IV cancer. 336-342-7796 1-800-772-1213 ? [place] Aging, Disability and Transit Services: Assists with nutrition, care and transit needs. 336-349-2343  Cancer Center Support Programs: @10RELATIVEDAYS@ > Cancer Support Group  2nd Tuesday of the month 1pm-2pm, Journey Room  > Creative Journey  3rd Tuesday of the month 1130am-1pm, Journey Room  > Look Good Feel Better  1st Wednesday of the month 10am-12 noon, Journey Room (Call American [employer] to register 1-800-395-5775)   

## 2016-07-18 NOTE — Progress Notes (Signed)
Brittany Rowland tolerated port flush well without complaints or incident. Port accessed with 20 gauge needle with blood return noted then flushed with 10 ml NS and 5 ml Heparin easily per protocol. Pt continues to take her Imbruvica as prescribed without any issues. Pt discharged self ambulatory in satisfactory condition

## 2016-09-05 ENCOUNTER — Encounter (HOSPITAL_BASED_OUTPATIENT_CLINIC_OR_DEPARTMENT_OTHER): Payer: BLUE CROSS/BLUE SHIELD | Admitting: Oncology

## 2016-09-05 ENCOUNTER — Other Ambulatory Visit (HOSPITAL_COMMUNITY): Payer: BLUE CROSS/BLUE SHIELD

## 2016-09-05 ENCOUNTER — Encounter (HOSPITAL_COMMUNITY): Payer: Self-pay | Admitting: Oncology

## 2016-09-05 ENCOUNTER — Ambulatory Visit (HOSPITAL_COMMUNITY): Payer: BLUE CROSS/BLUE SHIELD | Admitting: Adult Health

## 2016-09-05 ENCOUNTER — Encounter (HOSPITAL_COMMUNITY): Payer: BLUE CROSS/BLUE SHIELD | Attending: Oncology

## 2016-09-05 DIAGNOSIS — Z7289 Other problems related to lifestyle: Secondary | ICD-10-CM

## 2016-09-05 DIAGNOSIS — R05 Cough: Secondary | ICD-10-CM

## 2016-09-05 DIAGNOSIS — C9110 Chronic lymphocytic leukemia of B-cell type not having achieved remission: Secondary | ICD-10-CM | POA: Insufficient documentation

## 2016-09-05 DIAGNOSIS — R110 Nausea: Secondary | ICD-10-CM

## 2016-09-05 DIAGNOSIS — R12 Heartburn: Secondary | ICD-10-CM

## 2016-09-05 DIAGNOSIS — K219 Gastro-esophageal reflux disease without esophagitis: Secondary | ICD-10-CM

## 2016-09-05 DIAGNOSIS — E876 Hypokalemia: Secondary | ICD-10-CM

## 2016-09-05 DIAGNOSIS — Z95828 Presence of other vascular implants and grafts: Secondary | ICD-10-CM | POA: Insufficient documentation

## 2016-09-05 LAB — COMPREHENSIVE METABOLIC PANEL
ALT: 14 U/L (ref 14–54)
ANION GAP: 11 (ref 5–15)
AST: 18 U/L (ref 15–41)
Albumin: 4.1 g/dL (ref 3.5–5.0)
Alkaline Phosphatase: 55 U/L (ref 38–126)
BUN: 25 mg/dL — ABNORMAL HIGH (ref 6–20)
CHLORIDE: 102 mmol/L (ref 101–111)
CO2: 25 mmol/L (ref 22–32)
CREATININE: 1.15 mg/dL — AB (ref 0.44–1.00)
Calcium: 9.2 mg/dL (ref 8.9–10.3)
GFR, EST AFRICAN AMERICAN: 57 mL/min — AB (ref >60)
GFR, EST NON AFRICAN AMERICAN: 49 mL/min — AB (ref >60)
Glucose, Bld: 112 mg/dL — ABNORMAL HIGH (ref 65–99)
POTASSIUM: 3.3 mmol/L — AB (ref 3.5–5.1)
SODIUM: 138 mmol/L (ref 135–145)
Total Bilirubin: 0.7 mg/dL (ref 0.3–1.2)
Total Protein: 7.4 g/dL (ref 6.5–8.1)

## 2016-09-05 LAB — CBC WITH DIFFERENTIAL/PLATELET
BASOS PCT: 1 %
Basophils Absolute: 0.1 10*3/uL (ref 0.0–0.1)
Eosinophils Absolute: 0.4 10*3/uL (ref 0.0–0.7)
Eosinophils Relative: 4 %
HCT: 38.6 % (ref 36.0–46.0)
Hemoglobin: 12.9 g/dL (ref 12.0–15.0)
LYMPHS ABS: 5.3 10*3/uL — AB (ref 0.7–4.0)
Lymphocytes Relative: 55 %
MCH: 27.9 pg (ref 26.0–34.0)
MCHC: 33.4 g/dL (ref 30.0–36.0)
MCV: 83.4 fL (ref 78.0–100.0)
MONO ABS: 0.6 10*3/uL (ref 0.1–1.0)
Monocytes Relative: 6 %
NEUTROS PCT: 34 %
Neutro Abs: 3.3 10*3/uL (ref 1.7–7.7)
PLATELETS: 322 10*3/uL (ref 150–400)
RBC: 4.63 MIL/uL (ref 3.87–5.11)
RDW: 13.3 % (ref 11.5–15.5)
WBC: 9.7 10*3/uL (ref 4.0–10.5)

## 2016-09-05 LAB — LACTATE DEHYDROGENASE: LDH: 144 U/L (ref 98–192)

## 2016-09-05 MED ORDER — SODIUM CHLORIDE 0.9% FLUSH
10.0000 mL | INTRAVENOUS | Status: DC | PRN
  Administered 2016-09-05: 10 mL via INTRAVENOUS
  Filled 2016-09-05: qty 10

## 2016-09-05 MED ORDER — OMEPRAZOLE 20 MG PO CPDR: 20.0000 mg | DELAYED_RELEASE_CAPSULE | Freq: Every day | ORAL | 2 refills | Status: DC

## 2016-09-05 MED ORDER — HEPARIN SOD (PORK) LOCK FLUSH 100 UNIT/ML IV SOLN
500.0000 [IU] | Freq: Once | INTRAVENOUS | Status: AC
  Administered 2016-09-05: 500 [IU] via INTRAVENOUS

## 2016-09-05 NOTE — Assessment & Plan Note (Addendum)
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 Initially treated with bendamustine/Rituxan by Dr. Marijo Conception at [facility].   Patients with 13q deletion as the sole anomaly detected by [facility] are reported to have the longest survival time.  Labs today: CBC diff, CMET, LDH.  I personally reviewed and went over laboratory results with the patient.  The results are noted within this dictation.  Labs satisfy ongoing PO chemotherapy.  Nursing, in accordance with chemotherapy administration protocol, will monitor for acute side effects/toxicities associated with chemotherapy administration.  Compliance is encouraged.  Mild hypokalemia is noted.  She will take Kdur x 2 weeks daily.  She reports having some Kdur at home.  Port flushes every 6-8 weeks.  EtOH cessation education provided today.  She admits to drinking hard liquor multiple times per week.  She quite smoking in July 2016.  Labs in 12 weeks: CBC diff, CMET, LDH.  She reports GERD-like symptoms.  Rx for Omeprazole for GERD prescribed today.  This may help with her cough and nausea.  Return in 12 weeks for follow-up.

## 2016-09-05 NOTE — Progress Notes (Signed)
Brittany Sicks, PA-C [facility] [place] [address]  CLL (chronic lymphocytic leukemia) [facility]) - Plan: CBC with Differential, Comprehensive metabolic panel, Lactate dehydrogenase  Gastroesophageal reflux disease, esophagitis presence not specified - Plan: omeprazole (PRILOSEC) 20 MG capsule  CURRENT THERAPY: Imbruvica 420 mg daily, compliance is questionable.  INTERVAL HISTORY: Brittany Rowland 66 y.o. female returns for followup of CLL, 13q deletion, on Imbruvica 4 mg daily with questionable compliance. Initially treated with bendamustine/Rituxan by Dr. Marijo Conception at [facility].   Patients with 13q deletion as the sole anomaly detected by [facility] are reported to have the longest survival time. AND ETOHism AND H/O Tobacco abuse, quitting in July 2016    CLL (chronic lymphocytic leukemia) ([place])   12/16/2011 Initial Diagnosis    CLL (chronic lymphocytic leukemia), stage A ([place]), stage 0 (Rai)      01/13/2013 Pathology Results    Monoclonal B cell population is detected with kapp light chain restriction, representing 82% of peripheral leukocytes. B cell population expresses CD19, CD20, CD11C, CD23, and aberrant CD5. No blasts.      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        03/30/2014 Cancer Staging    CLL Stage B ([place]inet), Stage I (Rai), now with doubling of WBC and approximate 6 months and widespread adenopathy, dropping hemoglobin, but not under 11 g/dL yet, fatigue and weakness      05/31/2014 Cancer Staging    Stage III by both Binet and Rai staging systems, HGB 10.6 g/dL.      07/07/2014 Imaging    CT CAP- extensive adenopathy w/in low neck, chest, abdomen, and pelvis.  Centrilobular emphysema. Nonspecific 5 mm LLL pulmonary nodule. Nonspecific focal left-sided pleural nodularity. 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      08/02/2014 - 11/02/2014  Chemotherapy    Bendamustine/Rituxan x 4 cycles with normalization of HGB and WBC and PLT count with disappearance of all symptoms and lymph nodes      09/07/2014 Adverse Reaction    Hospitalization with fever and chills, acute bronchitis, negative blood cultures. Comlicated by Levaquin skin rash.  Bendamustine dose reduced for next cycle.      09/08/2014 Echocardiogram    Left ventricular wall motion and contractility are within normal limits.  The estimated ejection fraction is greater than 65%.  Normal left ventricular diastolic filling is observed.      12/21/2014 Treatment Plan Change    Chemotherapy discontinued following 4 cycles of therapy due to tremendouns response and disappearance of all symptoms and lymph nodes.      12/21/2014 Remission         06/22/2015 Relapse/Recurrence    Per Dr. [name]: "Now relapsing changes in her WBC count, lymphocyte count, LDH level. Before she gets symptomatic again, I think is worthy of starting her on Ibrutinib while she has a great performance status..."      06/29/2015 -  Chemotherapy    Ibrutinib initiated        HPI Elements   Location: Blood  Quality: CLL  Severity: Moderate  Duration: Dx in 2013  Context: S/P BR treatment  Timing:   Modifying Factors: Questionable compliance  Associated Signs & Symptoms: Chronic nausea   She reports occasional chest discomfort when she lays down to sleep. She notes it is worse when she lays down to sleep. She describes it as burning. She also notes a  dry "hacking" cough that is nonproductive. She continues to have chronic nausea which is unlikely related from her treatment. She asks about acid reflux symptoms.  Review of Systems  Constitutional: Negative.  Negative for chills, fever and weight loss.  HENT: Negative.   Eyes: Negative.   Respiratory: Positive for cough.   Cardiovascular: Negative.  Negative for chest pain.  Gastrointestinal: Positive for heartburn. Negative for blood in  stool, constipation, diarrhea, melena, nausea and vomiting.  Genitourinary: Negative.   Musculoskeletal: Negative.   Skin: Negative.   Neurological: Negative.  Negative for weakness.  Endo/Heme/Allergies: Negative.   Psychiatric/Behavioral: Negative.     Past Medical History:  Diagnosis Date  . Alcohol dependence, continuous ([place]) 05/15/2004   Overview:  ICD10 Conversion  . Anemia   . 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 CLL (chronic lymphocytic leukemia) ([place])   . Depression   . Diabetes mellitus without complication ([place])   . Hypertension   . Hypokalemia   . Leukemia ([place])   . Tobacco abuse 12/21/2011   Quit in July 2016    No past surgical history on file.  Family History  Problem Relation Age of Onset  . Heart failure Mother   . Diabetes Mother   . Hypertension Mother   . Heart failure Father     Social History   Social History  . Marital status: Married    Spouse name: N/A  . Number of children: N/A  . Years of education: N/A   Social History Main Topics  . Smoking status: Former Smoker    Packs/day: 1.00    Years: 45.00    Types: Cigarettes    Quit date: 09/15/2014  . Smokeless tobacco: Former Systems developer  . Alcohol use 1.2 oz/week    2 Shots of liquor per week  . Drug use: No  . Sexual activity: Not on file   Other Topics Concern  . Not on file   Social History Narrative  . No narrative on file     PHYSICAL EXAMINATION  ECOG PERFORMANCE STATUS: 1 - Symptomatic but completely ambulatory  There were no vitals filed for this visit.  BP 153/76 P 66 R 18 T 98.26F O2 sat 100% on RA  GENERAL:alert, no distress, well nourished, well developed, comfortable, cooperative, obese, smiling and unaccompanied SKIN: skin color, texture, turgor are normal, no rashes or significant lesions, positive for: ecchymosis on right forearm from mild trauma. HEAD: Normocephalic, No masses, lesions, tenderness or abnormalities EYES: normal, EOMI, Conjunctiva are  pink and non-injected EARS: External ears normal OROPHARYNX:lips, buccal mucosa, and tongue normal and mucous membranes are moist  NECK: supple, no adenopathy, trachea midline LYMPH:  no palpable lymphadenopathy BREAST:not examined LUNGS: clear to auscultation and percussion HEART: regular rate & rhythm, no murmurs, no gallops, S1 normal and S2 normal ABDOMEN:abdomen soft, non-tender and normal bowel sounds BACK: Back symmetric, no curvature. EXTREMITIES:less then 2 second capillary refill, no joint deformities, effusion, or inflammation, no skin discoloration, no clubbing, no cyanosis  NEURO: alert & oriented x 3 with fluent speech, no focal motor/sensory deficits, gait normal   LABORATORY DATA: CBC    Component Value Date/Time   WBC 9.7 09/05/2016 1030   RBC 4.63 09/05/2016 1030   HGB 12.9 09/05/2016 1030   HCT 38.6 09/05/2016 1030   PLT 322 09/05/2016 1030   MCV 83.4 09/05/2016 1030   MCH 27.9 09/05/2016 1030   MCHC 33.4 09/05/2016 1030   RDW 13.3 09/05/2016 1030   LYMPHSABS 5.3 (H)  09/05/2016 1030   MONOABS 0.6 09/05/2016 1030   EOSABS 0.4 09/05/2016 1030   BASOSABS 0.1 09/05/2016 1030      Chemistry      Component Value Date/Time   NA 138 09/05/2016 1030   K 3.3 (L) 09/05/2016 1030   CL 102 09/05/2016 1030   CO2 25 09/05/2016 1030   BUN 25 (H) 09/05/2016 1030   CREATININE 1.15 (H) 09/05/2016 1030      Component Value Date/Time   CALCIUM 9.2 09/05/2016 1030   ALKPHOS 55 09/05/2016 1030   AST 18 09/05/2016 1030   ALT 14 09/05/2016 1030   BILITOT 0.7 09/05/2016 1030        PENDING LABS:   RADIOGRAPHIC STUDIES:  No results found.   PATHOLOGY:    ASSESSMENT AND PLAN:  CLL (chronic lymphocytic leukemia) (HCC) CLL, 13q deletion, on Imbruvica 4 mg daily with questionable compliance. Initially treated with bendamustine/Rituxan by Dr. Marijo Conception at [facility].   Patients with 13q deletion as the sole anomaly detected by [facility] are reported to have the longest  survival time.  Labs today: CBC diff, CMET, LDH.  I personally reviewed and went over laboratory results with the patient.  The results are noted within this dictation.  Labs satisfy ongoing PO chemotherapy.  Nursing, in accordance with chemotherapy administration protocol, will monitor for acute side effects/toxicities associated with chemotherapy administration.  Compliance is encouraged.  Mild hypokalemia is noted.  She will take Kdur x 2 weeks daily.  She reports having some Kdur at home.  Port flushes every 6-8 weeks.  EtOH cessation education provided today.  She admits to drinking hard liquor multiple times per week.  She quite smoking in July 2016.  Labs in 12 weeks: CBC diff, CMET, LDH.  She reports GERD-like symptoms.  Rx for Omeprazole for GERD prescribed today.  This may help with her cough and nausea.  Return in 12 weeks for follow-up.   ORDERS PLACED FOR THIS ENCOUNTER: Orders Placed This Encounter  Procedures  . CBC with Differential  . Comprehensive metabolic panel  . Lactate dehydrogenase    MEDICATIONS PRESCRIBED THIS ENCOUNTER: Meds ordered this encounter  Medications  . omeprazole (PRILOSEC) 20 MG capsule    Sig: Take 1 capsule (20 mg total) by mouth daily.    Dispense:  30 capsule    Refill:  2    Order Specific Question:   Supervising Provider    Answer:   Brunetta Genera [[id number]]    THERAPY PLAN:  Continue with Imbruvica daily.  Compliance encouraged.  All questions were answered. The patient knows to call the clinic with any problems, questions or concerns. We can certainly see the patient much sooner if necessary.  Patient and plan discussed with Dr. Twana First and she is in agreement with the aforementioned.   This note is electronically signed by: Doy Mince 09/05/2016 11:19 AM

## 2016-09-05 NOTE — Progress Notes (Signed)
Brittany Rowland presented for Portacath access and flush. Portacath located right chest wall accessed with  H 20 needle. Good blood return present. Portacath flushed with 20ml NS and 500U/5ml Heparin and needle removed intact. Procedure without incident. Patient tolerated procedure well.     

## 2016-09-05 NOTE — Patient Instructions (Signed)
[place] at [facility] Discharge Instructions  RECOMMENDATIONS MADE BY THE CONSULTANT AND ANY TEST RESULTS WILL BE SENT TO YOUR REFERRING PHYSICIAN.  You were seen today by Kirby Crigler PA-C. Continue taking Zofran for nausea. Take Kdur daily for 2 weeks. Return in 12 weeks for labs and follow up.   Thank you for choosing [place] at [facility] to provide your oncology and hematology care.  To afford each patient quality time with our provider, please arrive at least 15 minutes before your scheduled appointment time.    If you have a lab appointment with the [place] please come in thru the  Main Entrance and check in at the main information desk  You need to re-schedule your appointment should you arrive 10 or more minutes late.  We strive to give you quality time with our providers, and arriving late affects you and other patients whose appointments are after yours.  Also, if you no show three or more times for appointments you may be dismissed from the clinic at the providers discretion.     Again, thank you for choosing [facility].  Our hope is that these requests will decrease the amount of time that you wait before being seen by our physicians.       _____________________________________________________________  Should you have questions after your visit to [facility], please contact our office at [phone] between the hours of 8:30 a.m. and 4:30 p.m.  Voicemails left after 4:30 p.m. will not be returned until the following business day.  For prescription refill requests, have your pharmacy contact our office.       Resources For Cancer Patients and their Caregivers ? American Cancer Society: Can assist with transportation, wigs, general needs, runs Look Good Feel Better.        ([phone] ? Cancer Care: Provides financial assistance, online support groups, medication/co-pay assistance.   1-800-813-HOPE 732-228-5418) ? [place] Assists [place] cancer patients and their families through emotional , educational and financial support.  705-449-5295 ? [place] DSS Where to apply for food stamps, Medicaid and utility assistance. (807)362-0351 ? RCATS: Transportation to medical appointments. [phone] ? Social Security Administration: May apply for disability if have a Stage IV cancer. [phone] 719-596-2089 ? [employer], Disability and Transit Services: Assists with nutrition, care and transit needs. [place] Support Programs: @10RELATIVEDAYS @ > Cancer Support Group  2nd Tuesday of the month 1pm-2pm, Journey Room  > Creative Journey  3rd Tuesday of the month 1130am-1pm, Journey Room  > Look Good Feel Better  1st Wednesday of the month 10am-12 noon, Journey Room (Call [place] to register [phone])

## 2016-10-17 ENCOUNTER — Encounter (HOSPITAL_COMMUNITY): Payer: BLUE CROSS/BLUE SHIELD

## 2016-10-24 ENCOUNTER — Encounter (HOSPITAL_COMMUNITY): Payer: BLUE CROSS/BLUE SHIELD | Attending: Oncology

## 2016-10-24 ENCOUNTER — Encounter (HOSPITAL_COMMUNITY): Payer: Self-pay

## 2016-10-24 VITALS — BP 141/72 | HR 66 | Temp 98.4°F | Resp 18

## 2016-10-24 DIAGNOSIS — Z95828 Presence of other vascular implants and grafts: Secondary | ICD-10-CM | POA: Insufficient documentation

## 2016-10-24 DIAGNOSIS — Z452 Encounter for adjustment and management of vascular access device: Secondary | ICD-10-CM | POA: Diagnosis not present

## 2016-10-24 DIAGNOSIS — C9110 Chronic lymphocytic leukemia of B-cell type not having achieved remission: Secondary | ICD-10-CM | POA: Insufficient documentation

## 2016-10-24 MED ORDER — HEPARIN SOD (PORK) LOCK FLUSH 100 UNIT/ML IV SOLN
INTRAVENOUS | Status: AC
  Filled 2016-10-24: qty 5

## 2016-10-24 MED ORDER — SODIUM CHLORIDE 0.9% FLUSH
10.0000 mL | Freq: Once | INTRAVENOUS | Status: AC
  Administered 2016-10-24: 10 mL via INTRAVENOUS

## 2016-10-24 MED ORDER — HEPARIN SOD (PORK) LOCK FLUSH 100 UNIT/ML IV SOLN
500.0000 [IU] | Freq: Once | INTRAVENOUS | Status: AC
  Administered 2016-10-24: 500 [IU] via INTRAVENOUS

## 2016-10-24 NOTE — Patient Instructions (Signed)
[place] at [facility]  Discharge Instructions:  Port flushed today.  Keep next scheduled appointment and call for any concerns or questions.  _______________________________________________________________  Thank you for choosing [place] at [facility] to provide your oncology and hematology care.  To afford each patient quality time with our providers, please arrive at least 15 minutes before your scheduled appointment.  You need to re-schedule your appointment if you arrive 10 or more minutes late.  We strive to give you quality time with our providers, and arriving late affects you and other patients whose appointments are after yours.  Also, if you no show three or more times for appointments you may be dismissed from the clinic.  Again, thank you for choosing [place] at [place] hope is that these requests will allow you access to exceptional care and in a timely manner. _______________________________________________________________  If you have questions after your visit, please contact our office at [phone] between the hours of 8:30 a.m. and 5:00 p.m. Voicemails left after 4:30 p.m. will not be returned until the following business day. _______________________________________________________________  For prescription refill requests, have your pharmacy contact our office. _______________________________________________________________  Recommendations made by the consultant and any test results will be sent to your referring physician. _______________________________________________________________

## 2016-10-24 NOTE — Progress Notes (Signed)
Port flushed per protocol.  Port site clean and dry with no bruising or swelling noted at site.  Band aid applied.  Patient reminded of new appointment date with labs same day.  VSS with discharge and left ambulatory with family.

## 2016-11-28 ENCOUNTER — Ambulatory Visit (HOSPITAL_COMMUNITY): Payer: BLUE CROSS/BLUE SHIELD | Admitting: Adult Health

## 2016-11-28 ENCOUNTER — Other Ambulatory Visit (HOSPITAL_COMMUNITY): Payer: BLUE CROSS/BLUE SHIELD

## 2016-11-29 ENCOUNTER — Other Ambulatory Visit (HOSPITAL_COMMUNITY): Payer: BLUE CROSS/BLUE SHIELD

## 2016-11-29 ENCOUNTER — Ambulatory Visit (HOSPITAL_COMMUNITY): Payer: BLUE CROSS/BLUE SHIELD | Admitting: Adult Health

## 2016-12-05 ENCOUNTER — Encounter (HOSPITAL_COMMUNITY): Payer: BLUE CROSS/BLUE SHIELD

## 2016-12-05 ENCOUNTER — Other Ambulatory Visit (HOSPITAL_COMMUNITY): Payer: Self-pay | Admitting: Adult Health

## 2016-12-05 ENCOUNTER — Encounter (HOSPITAL_COMMUNITY): Payer: Self-pay | Admitting: Adult Health

## 2016-12-05 ENCOUNTER — Encounter (HOSPITAL_COMMUNITY): Payer: BLUE CROSS/BLUE SHIELD | Attending: Adult Health | Admitting: Adult Health

## 2016-12-05 VITALS — BP 145/70 | HR 59 | Resp 16 | Ht 65.0 in | Wt 171.0 lb

## 2016-12-05 DIAGNOSIS — E119 Type 2 diabetes mellitus without complications: Secondary | ICD-10-CM | POA: Insufficient documentation

## 2016-12-05 DIAGNOSIS — Z87891 Personal history of nicotine dependence: Secondary | ICD-10-CM | POA: Diagnosis not present

## 2016-12-05 DIAGNOSIS — R110 Nausea: Secondary | ICD-10-CM

## 2016-12-05 DIAGNOSIS — C9110 Chronic lymphocytic leukemia of B-cell type not having achieved remission: Secondary | ICD-10-CM | POA: Insufficient documentation

## 2016-12-05 DIAGNOSIS — E876 Hypokalemia: Secondary | ICD-10-CM

## 2016-12-05 DIAGNOSIS — Z95828 Presence of other vascular implants and grafts: Secondary | ICD-10-CM

## 2016-12-05 DIAGNOSIS — I10 Essential (primary) hypertension: Secondary | ICD-10-CM | POA: Diagnosis not present

## 2016-12-05 DIAGNOSIS — Z79899 Other long term (current) drug therapy: Secondary | ICD-10-CM | POA: Insufficient documentation

## 2016-12-05 DIAGNOSIS — F329 Major depressive disorder, single episode, unspecified: Secondary | ICD-10-CM | POA: Diagnosis not present

## 2016-12-05 DIAGNOSIS — Z1231 Encounter for screening mammogram for malignant neoplasm of breast: Secondary | ICD-10-CM

## 2016-12-05 LAB — CBC WITH DIFFERENTIAL/PLATELET
Basophils Absolute: 0 10*3/uL (ref 0.0–0.1)
Basophils Relative: 0 %
EOS ABS: 0.4 10*3/uL (ref 0.0–0.7)
Eosinophils Relative: 5 %
HCT: 36.7 % (ref 36.0–46.0)
HEMOGLOBIN: 12.1 g/dL (ref 12.0–15.0)
LYMPHS PCT: 59 %
Lymphs Abs: 4.5 10*3/uL — ABNORMAL HIGH (ref 0.7–4.0)
MCH: 27.8 pg (ref 26.0–34.0)
MCHC: 33 g/dL (ref 30.0–36.0)
MCV: 84.4 fL (ref 78.0–100.0)
MONOS PCT: 4 %
Monocytes Absolute: 0.3 10*3/uL (ref 0.1–1.0)
NEUTROS PCT: 32 %
Neutro Abs: 2.4 10*3/uL (ref 1.7–7.7)
Platelets: 211 10*3/uL (ref 150–400)
RBC: 4.35 MIL/uL (ref 3.87–5.11)
RDW: 14.6 % (ref 11.5–15.5)
WBC: 7.5 10*3/uL (ref 4.0–10.5)

## 2016-12-05 LAB — COMPREHENSIVE METABOLIC PANEL
ALBUMIN: 3.8 g/dL (ref 3.5–5.0)
ALK PHOS: 49 U/L (ref 38–126)
ALT: 14 U/L (ref 14–54)
AST: 18 U/L (ref 15–41)
Anion gap: 11 (ref 5–15)
BILIRUBIN TOTAL: 0.6 mg/dL (ref 0.3–1.2)
BUN: 18 mg/dL (ref 6–20)
CALCIUM: 8.7 mg/dL — AB (ref 8.9–10.3)
CHLORIDE: 106 mmol/L (ref 101–111)
CO2: 22 mmol/L (ref 22–32)
CREATININE: 0.92 mg/dL (ref 0.44–1.00)
GFR calc Af Amer: >60 mL/min (ref >60)
GFR calc non Af Amer: >60 mL/min (ref >60)
GLUCOSE: 109 mg/dL — AB (ref 65–99)
Potassium: 3 mmol/L — ABNORMAL LOW (ref 3.5–5.1)
Sodium: 139 mmol/L (ref 135–145)
Total Protein: 6.7 g/dL (ref 6.5–8.1)

## 2016-12-05 LAB — LACTATE DEHYDROGENASE: LDH: 167 U/L (ref 98–192)

## 2016-12-05 MED ORDER — SODIUM CHLORIDE 0.9% FLUSH
10.0000 mL | INTRAVENOUS | Status: DC | PRN
  Administered 2016-12-05: 10 mL via INTRAVENOUS
  Filled 2016-12-05: qty 10

## 2016-12-05 MED ORDER — POTASSIUM CHLORIDE CRYS ER 20 MEQ PO TBCR: 40.0000 meq | EXTENDED_RELEASE_TABLET | Freq: Two times a day (BID) | ORAL | 0 refills | Status: AC

## 2016-12-05 MED ORDER — HEPARIN SOD (PORK) LOCK FLUSH 100 UNIT/ML IV SOLN
500.0000 [IU] | Freq: Once | INTRAVENOUS | Status: AC
  Administered 2016-12-05: 500 [IU] via INTRAVENOUS
  Filled 2016-12-05: qty 5

## 2016-12-05 NOTE — Progress Notes (Signed)
Brittany Rowland presented for Portacath access and flush. Portacath located rightchest wall accessed with  H 20 needle. Good blood return present. Portacath flushed with 64ml NS and 500U/76ml Heparin and needle removed intact. Procedure without incident. Patient tolerated procedure well.  Labs drawn per orders.

## 2016-12-05 NOTE — Patient Instructions (Signed)
[place] at [facility] Discharge Instructions  RECOMMENDATIONS MADE BY THE CONSULTANT AND ANY TEST RESULTS WILL BE SENT TO YOUR REFERRING PHYSICIAN.  You were seen today by Mike Craze NP. Mammogram due in 3 months. Return in 3 months for port flush, lab and follow up.  Thank you for choosing [place] at [facility] to provide your oncology and hematology care.  To afford each patient quality time with our provider, please arrive at least 15 minutes before your scheduled appointment time.    If you have a lab appointment with the [facility] please come in thru the  Main Entrance and check in at the main information desk  You need to re-schedule your appointment should you arrive 10 or more minutes late.  We strive to give you quality time with our providers, and arriving late affects you and other patients whose appointments are after yours.  Also, if you no show three or more times for appointments you may be dismissed from the clinic at the providers discretion.     Again, thank you for choosing [facility].  Our hope is that these requests will decrease the amount of time that you wait before being seen by our physicians.       _____________________________________________________________  Should you have questions after your visit to [facility], please contact our office at [phone] between the hours of 8:30 a.m. and 4:30 p.m.  Voicemails left after 4:30 p.m. will not be returned until the following business day.  For prescription refill requests, have your pharmacy contact our office.       Resources For Cancer Patients and their Caregivers ? American Cancer Society: Can assist with transportation, wigs, general needs, runs Look Good Feel Better.        939 247 5572 ? Cancer Care: Provides financial assistance, online support groups, medication/co-pay assistance.  1-800-813-HOPE  408-015-2025) ? [facility] Assists [place] cancer patients and their families through emotional , educational and financial support.  431 455 5159 ? [place] DSS Where to apply for food stamps, Medicaid and utility assistance. 701-756-4747 ? RCATS: Transportation to medical appointments. 217-633-2666 ? Social Security Administration: May apply for disability if have a Stage IV cancer. 973-271-3572 (248)521-8009 ? [employer], Disability and Transit Services: Assists with nutrition, care and transit needs. [place] Support Programs: @10RELATIVEDAYS @ > Cancer Support Group  2nd Tuesday of the month 1pm-2pm, Journey Room  > Creative Journey  3rd Tuesday of the month 1130am-1pm, Journey Room  > Look Good Feel Better  1st Wednesday of the month 10am-12 noon, Journey Room (Call [facility] to register [phone])

## 2016-12-05 NOTE — Progress Notes (Signed)
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 B cell population expresses CD19, CD20, CD11C, CD23, and aberrant CD5. No blasts.      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        03/30/2014 Cancer Staging    CLL Stage B ([place]), Stage I ([place]ai), now with doubling of WBC and approximate 6 months and widespread adenopathy, dropping hemoglobin, but not under 11 g/dL yet, fatigue and weakness      05/31/2014 Cancer Staging    Stage III by both [place] and Rai staging systems, HGB 10.6 g/dL.      07/07/2014 Imaging    CT CAP- extensive adenopathy w/in low neck, chest, abdomen, and pelvis.  Centrilobular emphysema. Nonspecific 5 mm LLL pulmonary nodule. Nonspecific focal left-sided pleural nodularity. 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      08/02/2014 - 11/02/2014 Chemotherapy    Bendamustine/Rituxan x 4 cycles with normalization of HGB and WBC and PLT count with disappearance of all symptoms and lymph nodes      09/07/2014 Adverse Reaction    Hospitalization with fever and chills, acute bronchitis, negative blood cultures. Comlicated by Levaquin skin rash.  Bendamustine dose reduced for next cycle.      09/08/2014 Echocardiogram    Left ventricular wall motion and contractility are within normal limits.  The  estimated ejection fraction is greater than 65%.  Normal left ventricular diastolic filling is observed.      12/21/2014 Treatment Plan Change    Chemotherapy discontinued following 4 cycles of therapy due to tremendouns response and disappearance of all symptoms and lymph nodes.      12/21/2014 Remission         06/22/2015 Relapse/Recurrence    Per Dr. Tressie Rowland: "Now relapsing changes in her WBC count, lymphocyte count, LDH level. Before she gets symptomatic again, I think is worthy of starting her on Ibrutinib while she has a great performance status..."      06/29/2015 -  Chemotherapy    Ibrutinib initiated        HISTORY OF PRESENT ILLNESS:  (From Brittany Crigler, PA-C's last note on 09/05/16)      INTERVAL HISTORY:  Brittany Rowland 66 y.o. female returns for routine follow-up for CLL.   Overall, she tells me she has been feeling "okay I think."  Appetite 100%. Energy levels are a little low at times; she attributes some of this to her work. 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 She is hoping to be able to retire next September when she turns 66 years old.    Remains on Imbruvica with good tolerance; denies any missed doses since her last visit.  Reports occasional nausea. "Sometimes I just wake up nauseated. It only happens every once in awhile."  She uses Zofran & Compazine PRN, which are helpful. Denies any vomiting.    Denies any fever/infection. Denies any frank bleeding episodes including blood in her stools, dark/tarry stools, hematuria, vaginal bleeding, or nosebleeds.  Reports recent cough, "but my family doctor told me it could be my blood pressure medication."  Reports that she got her annual flu shot with her PCP.  It has been several years since her last mammogram per patient; last mammogram was done in [place], [place] per patient.    Otherwise, she is largely without other complaints today.       REVIEW OF SYSTEMS:  Review of Systems  Constitutional: Positive for fatigue. Negative for chills and fever.  HENT:  Negative.   Eyes: Negative.   Respiratory: Positive for cough.   Cardiovascular: Negative.  Negative for chest pain and leg swelling.  Gastrointestinal: Positive for nausea. Negative for abdominal pain, blood in stool, constipation, diarrhea and vomiting.  Endocrine: Negative.   Genitourinary: Negative.  Negative for dysuria, hematuria and vaginal bleeding.   Musculoskeletal: Negative.   Skin: Negative.  Negative for rash.  Neurological: Negative.   Hematological: Does not bruise/bleed easily.  Psychiatric/Behavioral: Negative.      PAST MEDICAL/SURGICAL HISTORY:  Past Medical History:  Diagnosis Date  . Alcohol dependence, continuous ([place]) 05/15/2004   Overview:  ICD10 Conversion  . Anemia   . 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 CLL (chronic lymphocytic leukemia) ([place])   . Depression   . Diabetes mellitus without complication ([place])   . Hypertension   . Hypokalemia   . Leukemia ([place])   . Tobacco abuse 12/21/2011   Quit in July 2016   History reviewed. No pertinent surgical history.   SOCIAL HISTORY:  Social History   Social History  . Marital status: Married    Spouse name: N/A  . Number of children: N/A  . Years of education: N/A   Occupational History  . Not on file.   Social History Main Topics  . Smoking status: Former Smoker    Packs/day: 1.00    Years: 45.00    Types: Cigarettes    Quit date: 09/15/2014  . Smokeless tobacco: Former Systems developer  . Alcohol use 1.2 oz/week    2 Shots of liquor per week  . Drug use: No  . Sexual activity: Not on file   Other Topics Concern  . Not on file   Social History Narrative  . No narrative on file    FAMILY HISTORY:  Family History  Problem Relation Age of Onset  . Heart failure Mother   . Diabetes Mother   . Hypertension Mother   . Heart failure Father     CURRENT  MEDICATIONS:  Outpatient Encounter Prescriptions as of 12/05/2016  Medication Sig Note  . ALPRAZolam (XANAX) 0.25 MG tablet Take one tablet by mouth every 6 to 8 hours as needed for anxiety. 09/15/2015: Received from: [facility]  . atorvastatin (LIPITOR) 20 MG tablet    . IMBRUVICA 420 MG TABS TAKE 1 TABLET (420MG ) BY MOUTH ONE TIME DAILY WITH A FULL GLASS OF WATER.   Mar[place] Kitchen lidocaine-prilocaine (EMLA) cream Apply 1 application topically as needed.   [place] Kitchen lisinopril-hydrochlorothiazide (PRINZIDE,ZESTORETIC) 20-12.5 MG tablet Take 1 tablet by mouth daily.    . Multiple Vitamins-Minerals (THERA-M) TABS Take by mouth. 09/15/2015: Received from: [facility]: Take 1 tablet by mouth daily.  Marland Kitchen omeprazole (PRILOSEC) 20 MG capsule Take 1 capsule (20 mg total) by mouth daily.   Marland Kitchen  ondansetron (ZOFRAN) 8 MG tablet Take 1 tablet (8 mg total) by mouth every 8 (eight) hours as needed for nausea or vomiting.   . potassium chloride SA (K-DUR,KLOR-CON) 20 MEQ tablet Take 2 tablets (40 mEq total) by mouth 2 (two) times daily.   . prochlorperazine (COMPAZINE) 10 MG tablet Take 1 tablet (10 mg total) by mouth every 6 (six) hours as needed for nausea or vomiting.    No facility-administered encounter medications on file as of 12/05/2016.     ALLERGIES:  Allergies  Allergen Reactions  . Aspirin Nausea And Vomiting  . Levaquin  [Levofloxacin In D5w] Rash     PHYSICAL EXAM:  ECOG Performance status: 0-1 - Mildly symptomatic; remains independent   Vitals:   12/05/16 1509  BP: (!) 145/70  Pulse: (!) 59  Resp: 16  SpO2: 98%   Filed Weights   12/05/16 1509  Weight: 171 lb (77.6 kg)    Physical Exam  Constitutional: She is oriented to person, place, and time and well-developed, well-nourished, and in no distress.  HENT:  Head: Normocephalic.  Mouth/Throat: Oropharynx is clear and moist. No oropharyngeal exudate.  Eyes: Pupils are equal, round, and reactive to light. Conjunctivae are normal. No  scleral icterus.  Neck: Normal range of motion. Neck supple.  Cardiovascular: Normal rate, regular rhythm and normal heart sounds.   Pulmonary/Chest: Effort normal and breath sounds normal. No respiratory distress.  Abdominal: Soft. Bowel sounds are normal. There is no tenderness.  Musculoskeletal: Normal range of motion. She exhibits no edema.  Lymphadenopathy:    She has no cervical adenopathy.       Right: No supraclavicular adenopathy present.       Left: No supraclavicular adenopathy present.  Neurological: She is alert and oriented to person, place, and time. No cranial nerve deficit. Gait normal.  Skin: Skin is warm and dry. No rash noted.  Psychiatric: Mood and affect normal.  Nursing note and vitals reviewed.    LABORATORY DATA:  I have reviewed the labs as listed.  CBC    Component Value Date/Time   WBC 7.5 12/05/2016 1512   RBC 4.35 12/05/2016 1512   HGB 12.1 12/05/2016 1512   HCT 36.7 12/05/2016 1512   PLT 211 12/05/2016 1512   MCV 84.4 12/05/2016 1512   MCH 27.8 12/05/2016 1512   MCHC 33.0 12/05/2016 1512   RDW 14.6 12/05/2016 1512   LYMPHSABS 4.5 (H) 12/05/2016 1512   MONOABS 0.3 12/05/2016 1512   EOSABS 0.4 12/05/2016 1512   BASOSABS 0.0 12/05/2016 1512   CMP Latest Ref Rng & Units 09/05/2016 05/23/2016 03/28/2016  Glucose 65 - 99 mg/dL 112(H) 96 106(H)  BUN 6 - 20 mg/dL 25(H) 15 20  Creatinine 0.44 - 1.00 mg/dL 1.15(H) 0.82 0.98  Sodium 135 - 145 mmol/L 138 141 140  Potassium 3.5 - 5.1 mmol/L 3.3(L) 2.9(L) 3.3(L)  Chloride 101 - 111 mmol/L 102 106 107  CO2 22 - 32 mmol/L 25 25 25   Calcium 8.9 - 10.3 mg/dL 9.2 9.2 9.2  Total Protein 6.5 - 8.1 g/dL 7.4 7.0 6.8  Total Bilirubin 0.3 - 1.2 mg/dL 0.7 0.5 0.6  Alkaline Phos 38 - 126 U/L 55 48 54  AST 15 - 41 U/L 18 17 19   ALT 14 - 54 U/L 14 14 18     PENDING LABS:    DIAGNOSTIC IMAGING:    PATHOLOGY:     ASSESSMENT & PLAN:   CLL, 13q deletion:  -Diagnosed in 11/2011.  Patients with 13q  deletion as  sole chromosomal abnormality found on FISH have longest survival time.  Monitored for treatment parameters, until 2016 when she was treated with Bendamustine/Rituxan x 4 cycles from 08/02/14-09/01/14 with normalization of blood counts and lymphadenopathy.  Chemo course was complicated by hospitalization for fever requiring Bendamustin dose reduction.  Was noted to be in remission in 12/2014.  Relapsed disease noted in 06/2015 with changes in WBCs/lymphocyte count and LDH.  Ibrutinib started in 06/2015.  -Has history of questionable compliance on Imbruvica in the past. States that she has been compliant and has not missed any doses of her chemo since last visit.  Stressed the importance of continued compliance with her chemotherapy.  -Labs are pending for today. Port-a-cath flush performed by nursing and pressure had to be applied to site for several minutes to stop bleeding from needle puncture site.  Bleeding stopped and did not recur prior to being discharged from clinic.   -Return to cancer center in 3 months for follow-up with port flush/labs.   Addendum:     *CBC with diff resulted after patient left clinic.  WBCs normal; mild lymphocytosis noted on differential.  Hgb normal at 12.1 g/dL and platelets normal at 211,000.  Remaining labs are pending and we will contact her with those results when they are available.   Nausea:  -Continue Zofran and/or Compazine PRN.   -Chart reviewed; this has been a longstanding issue for her. Denies any vomiting.   Health maintenance/Wellness:  -Encouraged healthy diet and exercise as tolerated.   -Reportedly has been several years since her last mammogram. She would like to have her mammogram done at [facility]; orders placed to try to coordinate for mammogram to be done when she returns for follow-up in 3 months.  She agreed with this plan.   -Reports that she did receive her flu shot from her PCP for this season, which is great.  -Recommended continued follow-up  with PCP for other age/gender appropriate examinations.         Dispo:  -Screening mammogram in 3 months; orders placed today.  -Return to cancer center in 3 months for follow-up with port flush/labs.    All questions were answered to patient's stated satisfaction. Encouraged patient to call with any new concerns or questions before her next visit to the cancer center and we can certain see her sooner, if needed.    Plan of care discussed with Dr. Talbert Cage, who agrees with the above aforementioned.    Orders placed this encounter:  Orders Placed This Encounter  Procedures  . MM SCREENING BREAST TOMO BILATERAL  . CBC with Differential/Platelet  . Comprehensive metabolic panel  . Lactate dehydrogenase      Mike Craze, NP [place] [phone]

## 2017-02-28 ENCOUNTER — Ambulatory Visit (HOSPITAL_COMMUNITY): Payer: BLUE CROSS/BLUE SHIELD | Admitting: Oncology

## 2017-02-28 ENCOUNTER — Other Ambulatory Visit (HOSPITAL_COMMUNITY): Payer: BLUE CROSS/BLUE SHIELD

## 2017-02-28 ENCOUNTER — Ambulatory Visit (HOSPITAL_COMMUNITY): Payer: BLUE CROSS/BLUE SHIELD

## 2017-02-28 ENCOUNTER — Ambulatory Visit (HOSPITAL_COMMUNITY): Payer: BLUE CROSS/BLUE SHIELD | Admitting: Adult Health

## 2017-03-05 ENCOUNTER — Telehealth (HOSPITAL_COMMUNITY): Payer: Self-pay | Admitting: Oncology

## 2017-03-05 ENCOUNTER — Encounter (HOSPITAL_COMMUNITY): Payer: Self-pay | Admitting: Oncology

## 2017-03-05 ENCOUNTER — Inpatient Hospital Stay (HOSPITAL_COMMUNITY): Payer: BLUE CROSS/BLUE SHIELD

## 2017-03-05 ENCOUNTER — Other Ambulatory Visit: Payer: Self-pay

## 2017-03-05 ENCOUNTER — Inpatient Hospital Stay (HOSPITAL_COMMUNITY): Payer: BLUE CROSS/BLUE SHIELD | Attending: Oncology | Admitting: Oncology

## 2017-03-05 DIAGNOSIS — C9192 Lymphoid leukemia, unspecified, in relapse: Secondary | ICD-10-CM | POA: Diagnosis not present

## 2017-03-05 DIAGNOSIS — E876 Hypokalemia: Secondary | ICD-10-CM | POA: Diagnosis not present

## 2017-03-05 DIAGNOSIS — D649 Anemia, unspecified: Secondary | ICD-10-CM | POA: Diagnosis not present

## 2017-03-05 DIAGNOSIS — F329 Major depressive disorder, single episode, unspecified: Secondary | ICD-10-CM | POA: Insufficient documentation

## 2017-03-05 DIAGNOSIS — E119 Type 2 diabetes mellitus without complications: Secondary | ICD-10-CM | POA: Insufficient documentation

## 2017-03-05 DIAGNOSIS — C9110 Chronic lymphocytic leukemia of B-cell type not having achieved remission: Secondary | ICD-10-CM

## 2017-03-05 DIAGNOSIS — K5900 Constipation, unspecified: Secondary | ICD-10-CM | POA: Diagnosis not present

## 2017-03-05 DIAGNOSIS — R05 Cough: Secondary | ICD-10-CM | POA: Insufficient documentation

## 2017-03-05 DIAGNOSIS — F1020 Alcohol dependence, uncomplicated: Secondary | ICD-10-CM | POA: Insufficient documentation

## 2017-03-05 DIAGNOSIS — R42 Dizziness and giddiness: Secondary | ICD-10-CM | POA: Diagnosis not present

## 2017-03-05 DIAGNOSIS — I10 Essential (primary) hypertension: Secondary | ICD-10-CM | POA: Diagnosis not present

## 2017-03-05 DIAGNOSIS — R531 Weakness: Secondary | ICD-10-CM | POA: Insufficient documentation

## 2017-03-05 DIAGNOSIS — Z9221 Personal history of antineoplastic chemotherapy: Secondary | ICD-10-CM | POA: Insufficient documentation

## 2017-03-05 DIAGNOSIS — R112 Nausea with vomiting, unspecified: Secondary | ICD-10-CM | POA: Diagnosis not present

## 2017-03-05 DIAGNOSIS — J00 Acute nasopharyngitis [common cold]: Secondary | ICD-10-CM | POA: Diagnosis not present

## 2017-03-05 DIAGNOSIS — R5383 Other fatigue: Secondary | ICD-10-CM | POA: Diagnosis not present

## 2017-03-05 DIAGNOSIS — Z87891 Personal history of nicotine dependence: Secondary | ICD-10-CM | POA: Insufficient documentation

## 2017-03-05 DIAGNOSIS — Z79899 Other long term (current) drug therapy: Secondary | ICD-10-CM | POA: Insufficient documentation

## 2017-03-05 LAB — CBC WITH DIFFERENTIAL/PLATELET
BASOS ABS: 0 10*3/uL (ref 0.0–0.1)
Basophils Relative: 0 %
EOS PCT: 2 %
Eosinophils Absolute: 0.3 10*3/uL (ref 0.0–0.7)
HCT: 37.7 % (ref 36.0–46.0)
Hemoglobin: 12.2 g/dL (ref 12.0–15.0)
LYMPHS ABS: 11.6 10*3/uL — AB (ref 0.7–4.0)
Lymphocytes Relative: 75 %
MCH: 27.7 pg (ref 26.0–34.0)
MCHC: 32.4 g/dL (ref 30.0–36.0)
MCV: 85.7 fL (ref 78.0–100.0)
MONO ABS: 0.3 10*3/uL (ref 0.1–1.0)
Monocytes Relative: 2 %
NEUTROS PCT: 21 %
Neutro Abs: 3.3 10*3/uL (ref 1.7–7.7)
PLATELETS: 263 10*3/uL (ref 150–400)
RBC: 4.4 MIL/uL (ref 3.87–5.11)
RDW: 14.5 % (ref 11.5–15.5)
WBC: 15.5 10*3/uL — AB (ref 4.0–10.5)

## 2017-03-05 LAB — COMPREHENSIVE METABOLIC PANEL
ALBUMIN: 4.3 g/dL (ref 3.5–5.0)
ALT: 15 U/L (ref 14–54)
AST: 20 U/L (ref 15–41)
Alkaline Phosphatase: 48 U/L (ref 38–126)
Anion gap: 15 (ref 5–15)
BILIRUBIN TOTAL: 0.5 mg/dL (ref 0.3–1.2)
BUN: 29 mg/dL — AB (ref 6–20)
CHLORIDE: 103 mmol/L (ref 101–111)
CO2: 20 mmol/L — ABNORMAL LOW (ref 22–32)
CREATININE: 0.92 mg/dL (ref 0.44–1.00)
Calcium: 9.1 mg/dL (ref 8.9–10.3)
GFR calc Af Amer: >60 mL/min (ref >60)
GFR calc non Af Amer: >60 mL/min (ref >60)
GLUCOSE: 100 mg/dL — AB (ref 65–99)
POTASSIUM: 3 mmol/L — AB (ref 3.5–5.1)
Sodium: 138 mmol/L (ref 135–145)
TOTAL PROTEIN: 7.4 g/dL (ref 6.5–8.1)

## 2017-03-05 LAB — LACTATE DEHYDROGENASE: LDH: 160 U/L (ref 98–192)

## 2017-03-05 MED ORDER — SODIUM CHLORIDE 0.9% FLUSH
10.0000 mL | INTRAVENOUS | Status: DC | PRN
  Administered 2017-03-05: 10 mL via INTRAVENOUS
  Filled 2017-03-05: qty 10

## 2017-03-05 MED ORDER — HEPARIN SOD (PORK) LOCK FLUSH 100 UNIT/ML IV SOLN
INTRAVENOUS | Status: AC
  Filled 2017-03-05: qty 5

## 2017-03-05 MED ORDER — HEPARIN SOD (PORK) LOCK FLUSH 100 UNIT/ML IV SOLN
500.0000 [IU] | Freq: Once | INTRAVENOUS | Status: AC
  Administered 2017-03-05: 500 [IU] via INTRAVENOUS

## 2017-03-05 MED ORDER — IBRUTINIB 420 MG PO TABS: 420.0000 mg | ORAL_TABLET | Freq: Every day | ORAL | 0 refills | Status: DC

## 2017-03-05 NOTE — Progress Notes (Signed)
Brittany Rowland presented for Portacath access and flush. Portacath located right chest wall accessed with  H 20 needle. Good blood return present. Portacath flushed with 64ml NS and 500U/55ml Heparin and needle removed intact. Procedure without incident. Patient tolerated procedure well.

## 2017-03-05 NOTE — Patient Instructions (Signed)
[facility] at [facility] Discharge Instructions  RECOMMENDATIONS MADE BY THE CONSULTANT AND ANY TEST RESULTS WILL BE SENT TO YOUR REFERRING PHYSICIAN.  You were seen today by Jenny Burns, NP  Thank you for choosing [facility] at [facility] to provide your oncology and hematology care.  To afford each patient quality time with our provider, please arrive at least 15 minutes before your scheduled appointment time.    If you have a lab appointment with the Cancer Center please come in thru the  Main Entrance and check in at the main information desk  You need to re-schedule your appointment should you arrive 10 or more minutes late.  We strive to give you quality time with our providers, and arriving late affects you and other patients whose appointments are after yours.  Also, if you no show three or more times for appointments you may be dismissed from the clinic at the providers discretion.     Again, thank you for choosing [facility].  Our hope is that these requests will decrease the amount of time that you wait before being seen by our physicians.       _____________________________________________________________  Should you have questions after your visit to [facility], please contact our office at [phone] between the hours of 8:30 a.m. and 4:30 p.m.  Voicemails left after 4:30 p.m. will not be returned until the following business day.  For prescription refill requests, have your pharmacy contact our office.       Resources For Cancer Patients and their Caregivers ? American Cancer Society: Can assist with transportation, wigs, general needs, runs Look Good Feel Better.        1-888-227-6333 ? Cancer Care: Provides financial assistance, online support groups, medication/co-pay assistance.  1-800-813-HOPE (4673) ? [facility] Assists [place] cancer patients and their  families through emotional , educational and financial support.  336-427-4357 ? [place] DSS Where to apply for food stamps, Medicaid and utility assistance. 336-342-1394 ? RCATS: Transportation to medical appointments. 336-347-2287 ? Social Security Administration: May apply for disability if have a Stage IV cancer. 336-342-7796 1-800-772-1213 ? [place] [employer], Disability and Transit Services: Assists with nutrition, care and transit needs. 336-349-2343  Cancer Center Support Programs: @10RELATIVEDAYS@ > Cancer Support Group  2nd Tuesday of the month 1pm-2pm, Journey Room  > Creative Journey  3rd Tuesday of the month 1130am-1pm, Journey Room  > Look Good Feel Better  1st Wednesday of the month 10am-12 noon, Journey Room (Call [employer] to register 1-800-395-5775)    

## 2017-03-05 NOTE — Progress Notes (Signed)
Patient is taking Imbruiva and has missed a weeks worth of doses.  She states that she ran out and didn't have the time to call for more. She has been busy here lately.  Rulon Abide, NP aware.

## 2017-03-05 NOTE — Telephone Encounter (Signed)
FAXED [facility]

## 2017-03-05 NOTE — Progress Notes (Addendum)
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 B cell population expresses CD19, CD20, CD11C, CD23, and aberrant CD5. No blasts.      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        03/30/2014 Cancer Staging    CLL Stage B ([place]), Stage I ([facility]ai), now with doubling of WBC and approximate 6 months and widespread adenopathy, dropping hemoglobin, but not under 11 g/dL yet, fatigue and weakness      05/31/2014 Cancer Staging    Stage III by both [place] and [facility]ai staging systems, HGB 10.6 g/dL.      07/07/2014 Imaging    CT CAP- extensive adenopathy w/in low neck, chest, abdomen, and pelvis.  Centrilobular emphysema. Nonspecific 5 mm LLL pulmonary nodule. Nonspecific focal left-sided pleural nodularity. 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      08/02/2014 - 11/02/2014 Chemotherapy    Bendamustine/Rituxan x 4 cycles with normalization of HGB and WBC and PLT count with disappearance of all symptoms and lymph nodes      09/07/2014 Adverse Reaction    Hospitalization with fever and chills, acute bronchitis, negative blood cultures. Comlicated by Levaquin skin rash.  Bendamustine dose reduced for next cycle.      09/08/2014 Echocardiogram    Left ventricular wall motion and contractility are within normal limits.  The  estimated ejection fraction is greater than 65%.  Normal left ventricular diastolic filling is observed.      12/21/2014 Treatment Plan Change    Chemotherapy discontinued following 4 cycles of therapy due to tremendouns response and disappearance of all symptoms and lymph nodes.      12/21/2014 Remission         06/22/2015 Relapse/Recurrence    Per Dr. [name]: "Now relapsing changes in her WBC count, lymphocyte count, LDH level. Before she gets symptomatic again, I think is worthy of starting her on Ibrutinib while she has a great performance status..."      06/29/2015 -  Chemotherapy    Ibrutinib initiated        HISTORY OF PRESENT ILLNESS:  (From Kirby Crigler, PA-C's last note on 09/05/16)      INTERVAL HISTORY:  Brittany Rowland 67 y.o. female returns for routine follow-up for CLL.   Overall she tells me she has been feeling okay.  Appetite is 100% energy levels are 75%.  She denies any pain.  She recently came down with a cold and cough.  She denies any fevers.  But did have some chills.  She admits to fatigue and occasional weakness.  She states her fatigue is because she works third shift, 5 nights per week at [employer] requiring heavy lifting.  Additionally she complains of early morning nausea and vomiting with clear mucus production.  Occasional constipation relieved with OTC medications.  Occasional dizziness upon standing and  easy bruising.  She has not been taking her birth as prescribed and has been out for approximately 1 week.  She states her pharmacy told her "she broke the contract".  She denies any bleeding episodes including blood in her stools, dark tarry stools, hematuria, vaginal bleeding or nosebleeds.  Otherwise, she is largely without other complaints today.    REVIEW OF SYSTEMS:  Review of Systems  Constitutional: Positive for fatigue. Negative for chills and fever.  HENT:  Negative.   Eyes: Negative.   Respiratory: Positive for cough.         Sputum production in the morning that makes her gag  Cardiovascular: Negative.  Negative for chest pain and leg swelling.  Gastrointestinal: Positive for constipation and nausea (In the morning). Negative for abdominal pain, blood in stool, diarrhea and vomiting.  Endocrine: Negative.   Genitourinary: Negative.  Negative for dysuria, hematuria and vaginal bleeding.   Musculoskeletal: Negative.   Skin: Negative.  Negative for rash.  Neurological: Positive for dizziness (When standing).  Hematological: Bruises/bleeds easily (On bilateral arms).  Psychiatric/Behavioral: The patient is nervous/anxious.      PAST MEDICAL/SURGICAL HISTORY:  Past Medical History:  Diagnosis Date  . Alcohol dependence, continuous ([place]) 05/15/2004   Overview:  ICD10 Conversion  . Anemia   . 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 CLL (chronic lymphocytic leukemia) ([place])   . Depression   . Diabetes mellitus without complication ([place])   . Hypertension   . Hypokalemia   . Leukemia ([place])   . Tobacco abuse 12/21/2011   Quit in July 2016   History reviewed. No pertinent surgical history.   SOCIAL HISTORY:  Social History   Socioeconomic History  . Marital status: Married    Spouse name: Not on file  . Number of children: Not on file  . Years of education: Not on file  . Highest education level: Not on file  Social Needs  . Financial resource strain: Not on file  . Food insecurity - worry: Not on file  . Food insecurity - inability: Not on file  . Transportation needs - medical: Not on file  . Transportation needs - non-medical: Not on file  Occupational History  . Not on file  Tobacco Use  . Smoking status: Former Smoker    Packs/day: 1.00    Years: 45.00    Pack years: 45.00    Types: Cigarettes    Last attempt to quit: 09/15/2014    Years since quitting: 2.4  . Smokeless tobacco: Former Network engineer and Sexual Activity  . Alcohol use: Yes    Alcohol/week: 1.2 oz    Types: 2  Shots of liquor per week  . Drug use: No  . Sexual activity: Not on file  Other Topics Concern  . Not on file  Social History Narrative  . Not on file    FAMILY HISTORY:  Family History  Problem Relation Age of Onset  . Heart failure Mother   . Diabetes Mother   . Hypertension Mother   . Heart failure Father     CURRENT MEDICATIONS:  Outpatient Encounter Medications as of 03/05/2017  Medication Sig Note  . ALPRAZolam (XANAX) 0.25 MG tablet Take one tablet by mouth every 6 to 8 hours as needed for anxiety. 09/15/2015: Received from: [facility]  . atorvastatin (LIPITOR) 20 MG tablet    . lidocaine-prilocaine (EMLA) cream Apply 1 application topically as needed.   Marland Kitchen lisinopril-hydrochlorothiazide (PRINZIDE,ZESTORETIC) 20-12.5 MG tablet Take 1 tablet  by mouth daily.    . Multiple Vitamins-Minerals (THERA-M) TABS Take by mouth. 09/15/2015: Received from: [facility]: Take 1 tablet by mouth daily.  Marland Kitchen omeprazole (PRILOSEC) 20 MG capsule Take 1 capsule (20 mg total) by mouth daily.   . ondansetron (ZOFRAN) 8 MG tablet Take 1 tablet (8 mg total) by mouth every 8 (eight) hours as needed for nausea or vomiting.   . potassium chloride SA (K-DUR,KLOR-CON) 20 MEQ tablet Take 2 tablets (40 mEq total) by mouth 2 (two) times daily.   . prochlorperazine (COMPAZINE) 10 MG tablet Take 1 tablet (10 mg total) by mouth every 6 (six) hours as needed for nausea or vomiting.   . Ibrutinib (IMBRUVICA) 420 MG TABS Take 420 mg by mouth daily.   . [DISCONTINUED] IMBRUVICA 420 MG TABS TAKE 1 TABLET (420MG ) BY MOUTH ONE TIME DAILY WITH A FULL GLASS OF WATER. (Patient not taking: Reported on 03/05/2017)    No facility-administered encounter medications on file as of 03/05/2017.     ALLERGIES:  Allergies  Allergen Reactions  . Aspirin Nausea And Vomiting  . Levaquin  [Levofloxacin In D5w] Rash     PHYSICAL EXAM:  ECOG Performance status: 0-1 - Mildly symptomatic; remains independent    Vitals:   03/05/17 1002  BP: 129/66  Pulse: 83  Resp: 18  Temp: 98.3 F (36.8 C)  SpO2: 99%   Filed Weights   03/05/17 1002  Weight: 170 lb 4.8 oz (77.2 kg)    Physical Exam  Constitutional: She is oriented to person, place, and time and well-developed, well-nourished, and in no distress.  HENT:  Head: Normocephalic.  Mouth/Throat: Oropharynx is clear and moist. No oropharyngeal exudate.  Eyes: Conjunctivae are normal. Pupils are equal, round, and reactive to light. No scleral icterus.  Neck: Normal range of motion. Neck supple.  Cardiovascular: Normal rate, regular rhythm and normal heart sounds.  Pulmonary/Chest: Effort normal and breath sounds normal. No respiratory distress.  Abdominal: Soft. Bowel sounds are normal. There is no tenderness.  Musculoskeletal: Normal range of motion. She exhibits no edema.  Lymphadenopathy:    She has no cervical adenopathy.       Right: No supraclavicular adenopathy present.       Left: No supraclavicular adenopathy present.  Neurological: She is alert and oriented to person, place, and time. No cranial nerve deficit. Gait normal.  Skin: Skin is warm and dry. No rash noted.  Several small ecchymotic spots on bilateral arms  Psychiatric: Mood and affect normal.  Nursing note and vitals reviewed.    LABORATORY DATA:  I have reviewed the labs as listed.  CBC    Component Value Date/Time   WBC 7.5 12/05/2016 1512   RBC 4.35 12/05/2016 1512   HGB 12.1 12/05/2016 1512   HCT 36.7 12/05/2016 1512   PLT 211 12/05/2016 1512   MCV 84.4 12/05/2016 1512   MCH 27.8 12/05/2016 1512   MCHC 33.0 12/05/2016 1512   RDW 14.6 12/05/2016 1512   LYMPHSABS 4.5 (H) 12/05/2016 1512   MONOABS 0.3 12/05/2016 1512   EOSABS 0.4 12/05/2016 1512   BASOSABS 0.0 12/05/2016 1512   CMP Latest Ref Rng & Units 12/05/2016 09/05/2016 05/23/2016  Glucose 65 - 99 mg/dL 109(H) 112(H) 96  BUN 6 - 20 mg/dL 18 25(H) 15  Creatinine 0.44 - 1.00 mg/dL 0.92 1.15(H) 0.82   Sodium 135 - 145 mmol/L 139 138 141  Potassium 3.5 - 5.1 mmol/L 3.0(L) 3.3(L) 2.9(L)  Chloride 101 - 111 mmol/L  106 102 106  CO2 22 - 32 mmol/L 22 25 25   Calcium 8.9 - 10.3 mg/dL 8.7(L) 9.2 9.2  Total Protein 6.5 - 8.1 g/dL 6.7 7.4 7.0  Total Bilirubin 0.3 - 1.2 mg/dL 0.6 0.7 0.5  Alkaline Phos 38 - 126 U/L 49 55 48  AST 15 - 41 U/L 18 18 17   ALT 14 - 54 U/L 14 14 14     PENDING LABS:    DIAGNOSTIC IMAGING:    PATHOLOGY:     ASSESSMENT & PLAN:   CLL, 13q deletion:  -Diagnosed in 11/2011.  Patients with 13q deletion as sole chromosomal abnormality found on FISH have longest survival time.  Monitored for treatment parameters, until 2016 when she was treated with Bendamustine/Rituxan x 4 cycles from 08/02/14-09/01/14 with normalization of blood counts and lymphadenopathy.  Chemo course was complicated by hospitalization for fever requiring Bendamustin dose reduction.  Was noted to be in remission in 12/2014.  Relapsed disease noted in 06/2015 with changes in WBCs/lymphocyte count and LDH.  Ibrutinib started in 06/2015. -Dizziness: Encouraged patient to stay adequately hydrated.  Change positions slowly.  This is not a new complaint. -Constipation: Continue OTC medications as needed. -Sputum production/nausea/vomiting in the morning: Patient is a former smoker.  Patient states this goes away after she produces enough sputum.  Encouraged her to use Mucinex OTC as needed to thin secretions.  Patient in agreement with plan.  Continue antiemetics as needed. -Easy bruising: We will wait for labs from today.  Patient is not on any blood thinners. Platelet count 263.  -Health maintenance: Patient is scheduled to have mammogram screening bilateral breasts on 03/13/2017 -Questionable compliance on Imbruvica.  Patient states she has been compliant and has not missed any doses of her oral chemo until recently when she has run out.  Patient states pharmacy called and told her she "was not compliant with  her contract" refused to refill her medication. [facility]  Stressed the importance of continued compliance with her chemotherapy. -Labs are pending for today.  Will call patient with results. Labs looked ok.  -Return to clinic in 3 months for follow-up with port flushes and labs.   Dispo:  -Mammogram next week. -Return to cancer center in 3 months for follow-up with port flush/labs.   All questions were answered to patient's stated satisfaction. Encouraged patient to call with any new concerns or questions before her next visit to the cancer center and we can certain see her sooner, if needed.    Greater than 50% was spent in counseling and coordination of care with this patient including but not limited to discussion of the relevant topics above (See A&P) including, but not limited to diagnosis and management of acute and chronic medical conditions.    Orders placed this encounter:  No orders of the defined types were placed in this encounter.   Faythe Casa, NP 03/05/2017 12:53 PM

## 2017-03-06 ENCOUNTER — Telehealth (HOSPITAL_COMMUNITY): Payer: Self-pay

## 2017-03-06 LAB — PATHOLOGIST SMEAR REVIEW

## 2017-03-06 NOTE — Telephone Encounter (Signed)
-----   Message from Jacquelin Hawking, NP sent at 03/06/2017 11:24 AM EST ----- Can someone call this patient with her labs? I tried but she did not answer and I could not leave a message. They look good.  Her platelet count is normal so I do not know what is causing her easy bruising.  Thanks,  Sonia Baller

## 2017-03-06 NOTE — Telephone Encounter (Signed)
Spoke with patient and let her know that labs were normal and the NP did not know what was causing her to bruise easily. Patient verbalized understanding.

## 2017-03-13 ENCOUNTER — Ambulatory Visit (HOSPITAL_COMMUNITY): Payer: BLUE CROSS/BLUE SHIELD

## 2017-03-20 ENCOUNTER — Encounter (HOSPITAL_COMMUNITY): Payer: Self-pay | Admitting: Radiology

## 2017-03-20 ENCOUNTER — Ambulatory Visit (HOSPITAL_COMMUNITY)
Admission: RE | Admit: 2017-03-20 | Discharge: 2017-03-20 | Disposition: A | Discharge status: Home / Self Care | Payer: BLUE CROSS/BLUE SHIELD | Source: Ambulatory Visit | Attending: Adult Health | Admitting: Adult Health

## 2017-03-20 DIAGNOSIS — R928 Other abnormal and inconclusive findings on diagnostic imaging of breast: Secondary | ICD-10-CM | POA: Diagnosis not present

## 2017-03-20 DIAGNOSIS — Z1231 Encounter for screening mammogram for malignant neoplasm of breast: Secondary | ICD-10-CM | POA: Insufficient documentation

## 2017-03-25 ENCOUNTER — Other Ambulatory Visit: Payer: Self-pay | Admitting: Adult Health

## 2017-03-25 DIAGNOSIS — R229 Localized swelling, mass and lump, unspecified: Principal | ICD-10-CM

## 2017-03-25 DIAGNOSIS — IMO0002 Reserved for concepts with insufficient information to code with codable children: Secondary | ICD-10-CM

## 2017-03-26 ENCOUNTER — Other Ambulatory Visit (HOSPITAL_COMMUNITY): Payer: Self-pay | Admitting: Adult Health

## 2017-03-30 ENCOUNTER — Other Ambulatory Visit (HOSPITAL_COMMUNITY): Payer: Self-pay | Admitting: Oncology

## 2017-03-30 DIAGNOSIS — C9110 Chronic lymphocytic leukemia of B-cell type not having achieved remission: Secondary | ICD-10-CM

## 2017-04-02 ENCOUNTER — Other Ambulatory Visit (HOSPITAL_COMMUNITY): Payer: BLUE CROSS/BLUE SHIELD

## 2017-04-02 ENCOUNTER — Ambulatory Visit (HOSPITAL_COMMUNITY)
Admission: RE | Admit: 2017-04-02 | Discharge: 2017-04-02 | Disposition: A | Discharge status: Home / Self Care | Payer: BLUE CROSS/BLUE SHIELD | Source: Ambulatory Visit | Attending: Adult Health | Admitting: Adult Health

## 2017-04-09 ENCOUNTER — Ambulatory Visit (HOSPITAL_COMMUNITY)
Admission: RE | Admit: 2017-04-09 | Discharge: 2017-04-09 | Disposition: A | Discharge status: Home / Self Care | Payer: BLUE CROSS/BLUE SHIELD | Source: Ambulatory Visit | Attending: Adult Health | Admitting: Adult Health

## 2017-04-09 DIAGNOSIS — R229 Localized swelling, mass and lump, unspecified: Secondary | ICD-10-CM | POA: Diagnosis not present

## 2017-04-09 DIAGNOSIS — L723 Sebaceous cyst: Secondary | ICD-10-CM | POA: Diagnosis not present

## 2017-04-09 DIAGNOSIS — IMO0002 Reserved for concepts with insufficient information to code with codable children: Secondary | ICD-10-CM

## 2017-04-10 ENCOUNTER — Other Ambulatory Visit (HOSPITAL_COMMUNITY): Payer: Self-pay | Admitting: Adult Health

## 2017-04-10 DIAGNOSIS — R928 Other abnormal and inconclusive findings on diagnostic imaging of breast: Secondary | ICD-10-CM

## 2017-04-16 ENCOUNTER — Encounter (HOSPITAL_COMMUNITY): Payer: Self-pay

## 2017-04-16 ENCOUNTER — Inpatient Hospital Stay (HOSPITAL_COMMUNITY): Payer: BLUE CROSS/BLUE SHIELD | Attending: Oncology

## 2017-04-16 DIAGNOSIS — C9190 Lymphoid leukemia, unspecified not having achieved remission: Secondary | ICD-10-CM | POA: Insufficient documentation

## 2017-04-16 DIAGNOSIS — C9192 Lymphoid leukemia, unspecified, in relapse: Secondary | ICD-10-CM | POA: Diagnosis present

## 2017-04-16 DIAGNOSIS — Z452 Encounter for adjustment and management of vascular access device: Secondary | ICD-10-CM | POA: Insufficient documentation

## 2017-04-16 MED ORDER — FULVESTRANT 250 MG/5ML IM SOLN
INTRAMUSCULAR | Status: AC
  Filled 2017-04-16: qty 5

## 2017-04-16 MED ORDER — HEPARIN SOD (PORK) LOCK FLUSH 100 UNIT/ML IV SOLN
500.0000 [IU] | Freq: Once | INTRAVENOUS | Status: AC
  Administered 2017-04-16: 500 [IU] via INTRAVENOUS

## 2017-04-16 MED ORDER — HEPARIN SOD (PORK) LOCK FLUSH 100 UNIT/ML IV SOLN
INTRAVENOUS | Status: AC
  Filled 2017-04-16: qty 5

## 2017-04-16 MED ORDER — SODIUM CHLORIDE 0.9% FLUSH
10.0000 mL | INTRAVENOUS | Status: DC | PRN
  Administered 2017-04-16: 10 mL via INTRAVENOUS
  Filled 2017-04-16: qty 10

## 2017-04-16 NOTE — Progress Notes (Signed)
Eldine Akey tolerated portacath flush well without complaints or incident. Port accessed with 20 gauge needle with blood return noted then flushed with 10 ml NS and 5 ml Heparin easily per protocol then de-accessed. VSS Pt discharged self ambulatory in satisfactory condition 

## 2017-04-16 NOTE — Patient Instructions (Signed)
[facility] at [facility] Discharge Instructions  RECOMMENDATIONS MADE BY THE CONSULTANT AND ANY TEST RESULTS WILL BE SENT TO YOUR REFERRING PHYSICIAN.  Portacath flushed per protocol today. Follow-up as scheduled. Call clinic for any questions or concerns  Thank you for choosing [facility] at [facility] to provide your oncology and hematology care.  To afford each patient quality time with our provider, please arrive at least 15 minutes before your scheduled appointment time.    If you have a lab appointment with the Cancer Center please come in thru the  Main Entrance and check in at the main information desk  You need to re-schedule your appointment should you arrive 10 or more minutes late.  We strive to give you quality time with our providers, and arriving late affects you and other patients whose appointments are after yours.  Also, if you no show three or more times for appointments you may be dismissed from the clinic at the providers discretion.     Again, thank you for choosing [facility].  Our hope is that these requests will decrease the amount of time that you wait before being seen by our physicians.       _____________________________________________________________  Should you have questions after your visit to [facility], please contact our office at [phone] between the hours of 8:30 a.m. and 4:30 p.m.  Voicemails left after 4:30 p.m. will not be returned until the following business day.  For prescription refill requests, have your pharmacy contact our office.       Resources For Cancer Patients and their Caregivers ? American Cancer Society: Can assist with transportation, wigs, general needs, runs Look Good Feel Better.        1-888-227-6333 ? Cancer Care: Provides financial assistance, online support groups, medication/co-pay assistance.  1-800-813-HOPE (4673) ? [facility] Assists [place] cancer patients and their families through emotional , educational and financial support.  336-427-4357 ? [place] DSS Where to apply for food stamps, Medicaid and utility assistance. 336-342-1394 ? [facility]: Transportation to medical appointments. 336-347-2287 ? Social Security Administration: May apply for disability if have a Stage IV cancer. 336-342-7796 1-800-772-1213 ? [place] Aging, Disability and Transit Services: Assists with nutrition, care and transit needs. 336-349-2343  Cancer Center Support Programs: @10RELATIVEDAYS@ > Cancer Support Group  2nd Tuesday of the month 1pm-2pm, Journey Room  > Creative Journey  3rd Tuesday of the month 1130am-1pm, Journey Room  > Look Good Feel Better  1st Wednesday of the month 10am-12 noon, Journey Room (Call American Cancer Society to register 1-800-395-5775)   

## 2017-04-30 ENCOUNTER — Other Ambulatory Visit (HOSPITAL_COMMUNITY): Payer: Self-pay | Admitting: Emergency Medicine

## 2017-04-30 DIAGNOSIS — C9110 Chronic lymphocytic leukemia of B-cell type not having achieved remission: Secondary | ICD-10-CM

## 2017-04-30 MED ORDER — IBRUTINIB 420 MG PO TABS: 420.0000 mg | ORAL_TABLET | Freq: Every day | ORAL | 0 refills | Status: DC

## 2017-04-30 NOTE — Progress Notes (Signed)
imbruvica refilled  

## 2017-05-28 ENCOUNTER — Ambulatory Visit (HOSPITAL_COMMUNITY): Payer: BLUE CROSS/BLUE SHIELD

## 2017-05-28 ENCOUNTER — Other Ambulatory Visit (HOSPITAL_COMMUNITY): Payer: BLUE CROSS/BLUE SHIELD

## 2017-05-29 ENCOUNTER — Ambulatory Visit (HOSPITAL_COMMUNITY): Payer: BLUE CROSS/BLUE SHIELD | Admitting: Internal Medicine

## 2017-05-29 ENCOUNTER — Other Ambulatory Visit (HOSPITAL_COMMUNITY): Payer: BLUE CROSS/BLUE SHIELD

## 2017-06-14 ENCOUNTER — Encounter (HOSPITAL_COMMUNITY): Payer: Self-pay | Admitting: Internal Medicine

## 2017-06-14 ENCOUNTER — Other Ambulatory Visit: Payer: Self-pay

## 2017-06-14 ENCOUNTER — Inpatient Hospital Stay (HOSPITAL_COMMUNITY): Payer: BLUE CROSS/BLUE SHIELD

## 2017-06-14 ENCOUNTER — Inpatient Hospital Stay (HOSPITAL_COMMUNITY): Payer: BLUE CROSS/BLUE SHIELD | Attending: Oncology | Admitting: Internal Medicine

## 2017-06-14 ENCOUNTER — Other Ambulatory Visit (HOSPITAL_COMMUNITY): Payer: Self-pay | Admitting: Internal Medicine

## 2017-06-14 VITALS — BP 140/56 | HR 68 | Temp 98.5°F | Resp 18 | Wt 169.8 lb

## 2017-06-14 DIAGNOSIS — F329 Major depressive disorder, single episode, unspecified: Secondary | ICD-10-CM

## 2017-06-14 DIAGNOSIS — Z79899 Other long term (current) drug therapy: Secondary | ICD-10-CM | POA: Diagnosis not present

## 2017-06-14 DIAGNOSIS — F1020 Alcohol dependence, uncomplicated: Secondary | ICD-10-CM | POA: Insufficient documentation

## 2017-06-14 DIAGNOSIS — C9192 Lymphoid leukemia, unspecified, in relapse: Secondary | ICD-10-CM

## 2017-06-14 DIAGNOSIS — R911 Solitary pulmonary nodule: Secondary | ICD-10-CM | POA: Diagnosis not present

## 2017-06-14 DIAGNOSIS — C9110 Chronic lymphocytic leukemia of B-cell type not having achieved remission: Secondary | ICD-10-CM

## 2017-06-14 DIAGNOSIS — E119 Type 2 diabetes mellitus without complications: Secondary | ICD-10-CM | POA: Diagnosis not present

## 2017-06-14 DIAGNOSIS — M8580 Other specified disorders of bone density and structure, unspecified site: Secondary | ICD-10-CM | POA: Diagnosis not present

## 2017-06-14 DIAGNOSIS — I10 Essential (primary) hypertension: Secondary | ICD-10-CM | POA: Insufficient documentation

## 2017-06-14 DIAGNOSIS — Z87891 Personal history of nicotine dependence: Secondary | ICD-10-CM | POA: Diagnosis not present

## 2017-06-14 DIAGNOSIS — E876 Hypokalemia: Secondary | ICD-10-CM | POA: Diagnosis not present

## 2017-06-14 DIAGNOSIS — Z95828 Presence of other vascular implants and grafts: Secondary | ICD-10-CM

## 2017-06-14 DIAGNOSIS — R110 Nausea: Secondary | ICD-10-CM | POA: Diagnosis not present

## 2017-06-14 DIAGNOSIS — D649 Anemia, unspecified: Secondary | ICD-10-CM | POA: Diagnosis not present

## 2017-06-14 DIAGNOSIS — L723 Sebaceous cyst: Secondary | ICD-10-CM | POA: Diagnosis not present

## 2017-06-14 DIAGNOSIS — J439 Emphysema, unspecified: Secondary | ICD-10-CM | POA: Insufficient documentation

## 2017-06-14 DIAGNOSIS — N63 Unspecified lump in unspecified breast: Secondary | ICD-10-CM

## 2017-06-14 DIAGNOSIS — E7800 Pure hypercholesterolemia, unspecified: Secondary | ICD-10-CM | POA: Diagnosis not present

## 2017-06-14 LAB — CBC WITH DIFFERENTIAL/PLATELET
BASOS ABS: 0 10*3/uL (ref 0.0–0.1)
Basophils Relative: 0 %
EOS ABS: 0.3 10*3/uL (ref 0.0–0.7)
Eosinophils Relative: 1 %
HCT: 38.4 % (ref 36.0–46.0)
HEMOGLOBIN: 12.3 g/dL (ref 12.0–15.0)
LYMPHS PCT: 85 %
Lymphs Abs: 23.3 10*3/uL — ABNORMAL HIGH (ref 0.7–4.0)
MCH: 27.2 pg (ref 26.0–34.0)
MCHC: 32 g/dL (ref 30.0–36.0)
MCV: 85 fL (ref 78.0–100.0)
MONO ABS: 0.5 10*3/uL (ref 0.1–1.0)
Monocytes Relative: 2 %
NEUTROS PCT: 12 %
Neutro Abs: 3.3 10*3/uL (ref 1.7–7.7)
PLATELETS: 262 10*3/uL (ref 150–400)
RBC: 4.52 MIL/uL (ref 3.87–5.11)
RDW: 14.2 % (ref 11.5–15.5)
WBC: 27.4 10*3/uL — ABNORMAL HIGH (ref 4.0–10.5)

## 2017-06-14 LAB — COMPREHENSIVE METABOLIC PANEL
ALBUMIN: 4.2 g/dL (ref 3.5–5.0)
ALK PHOS: 51 U/L (ref 38–126)
ALT: 16 U/L (ref 14–54)
ANION GAP: 12 (ref 5–15)
AST: 19 U/L (ref 15–41)
BUN: 28 mg/dL — ABNORMAL HIGH (ref 6–20)
CHLORIDE: 104 mmol/L (ref 101–111)
CO2: 21 mmol/L — AB (ref 22–32)
Calcium: 9.1 mg/dL (ref 8.9–10.3)
Creatinine, Ser: 0.97 mg/dL (ref 0.44–1.00)
GFR calc Af Amer: >60 mL/min (ref >60)
GFR calc non Af Amer: 60 mL/min — ABNORMAL LOW (ref >60)
Glucose, Bld: 99 mg/dL (ref 65–99)
POTASSIUM: 3.3 mmol/L — AB (ref 3.5–5.1)
SODIUM: 137 mmol/L (ref 135–145)
Total Bilirubin: 1.1 mg/dL (ref 0.3–1.2)
Total Protein: 7.1 g/dL (ref 6.5–8.1)

## 2017-06-14 LAB — LACTATE DEHYDROGENASE: LDH: 156 U/L (ref 98–192)

## 2017-06-14 MED ORDER — SODIUM CHLORIDE 0.9% FLUSH
10.0000 mL | Freq: Once | INTRAVENOUS | Status: AC
  Administered 2017-06-14: 10 mL via INTRAVENOUS

## 2017-06-14 MED ORDER — HEPARIN SOD (PORK) LOCK FLUSH 100 UNIT/ML IV SOLN
INTRAVENOUS | Status: AC
Start: 2017-06-14 — End: 2017-06-14
  Filled 2017-06-14: qty 5

## 2017-06-14 MED ORDER — HEPARIN SOD (PORK) LOCK FLUSH 100 UNIT/ML IV SOLN
500.0000 [IU] | Freq: Once | INTRAVENOUS | Status: AC
  Administered 2017-06-14: 500 [IU] via INTRAVENOUS

## 2017-06-14 NOTE — Patient Instructions (Signed)
[facility] at [facility]  Discharge Instructions:  Your port was flushed today. _______________________________________________________________  Thank you for choosing [facility] at [facility] to provide your oncology and hematology care.  To afford each patient quality time with our providers, please arrive at least 15 minutes before your scheduled appointment.  You need to re-schedule your appointment if you arrive 10 or more minutes late.  We strive to give you quality time with our providers, and arriving late affects you and other patients whose appointments are after yours.  Also, if you no show three or more times for appointments you may be dismissed from the clinic.  Again, thank you for choosing [facility] at [facility]. Our hope is that these requests will allow you access to exceptional care and in a timely manner. _______________________________________________________________  If you have questions after your visit, please contact our office at [phone] between the hours of 8:30 a.m. and 5:00 p.m. Voicemails left after 4:30 p.m. will not be returned until the following business day. _______________________________________________________________  For prescription refill requests, have your pharmacy contact our office. _______________________________________________________________  Recommendations made by the consultant and any test results will be sent to your referring physician. _______________________________________________________________ 

## 2017-06-14 NOTE — Progress Notes (Signed)
Patients port flushed with lab work.  Site clean and dry with no complaints.  Good blood return noted.  Site clean and dry with no bruising or swelling noted at site.  Band aid applied.  VSs with discharge and left ambulatory with no s/s of distress noted.

## 2017-06-14 NOTE — Progress Notes (Signed)
Diagnosis CLL (chronic lymphocytic leukemia) ([place]) - Plan: CBC with Differential/Platelet, Comprehensive metabolic panel, Lactate dehydrogenase, Beta 2 microglobulin, serum, CT Chest W Contrast, CT Abdomen Pelvis W Contrast  Staging Cancer Staging No matching staging information was found for the patient.  Assessment and Plan:  1.CLL, 13q deletion.  Pt was diagnosed in 11/2011.  Patients with 13q deletion as sole chromosomal abnormality found on FISH have longest survival time.  Monitored for treatment parameters, until 2016 when she was treated with Bendamustine/Rituxan x 4 cycles from 08/02/14-09/01/14 with normalization of blood counts and lymphadenopathy.  Chemo course was complicated by hospitalization for fever requiring Bendamustin dose reduction.  Was noted to be in remission in 12/2014.  Relapsed disease noted in 06/2015 with changes in WBCs/lymphocyte count and LDH.  Ibrutinib started in 06/2015. Pt has history of questionable compliance on Imbruvica in the past.     Labs done today 06/14/2017 show white count 27.4 hemoglobin 12.3 platelets 262,000.  White count is higher than labs that were done in January 2019 when WBC was 15,000.  Last CAT scans were done at outside facility on 07/07/2014 and showed extensive adenopathy in the neck chest abdomen and pelvis consistent with leukemia/lymphoma.  She also had evidence of emphysema with a nonspecific 5 mm left lower lobe pulmonary nodule.  In light of the worsening white count she will be set up for repeat imaging at any pin for interval evaluation.  I have stressed to her the need for compliance with therapy and if she is unable to be compliant may have to consider looking at infusional therapies.  2.  Pulmonary nodule.  This was reported on CAT scan done at an outside facility Jul 07, 2014.  Nodule measured 5 mm.  She has a history of smoking but reports she has quit smoking.  She will undergo repeat CT of the chest.  3.  Hypertension.  Blood  pressure is 140/56.  Continue follow-up with PCP.  4.  Nausea.  She reports she has undergone GI evaluation in the past.  Continue antiemetics as needed and notify the office if symptoms fail to improve.  5.  Compliance with therapy.  When questioned she has not been taking ibrutinib as directed.  I have discussed with her white count has worsened today compared to labs that were done in January.  If she continues to remain noncompliant will have to have discussion concerning infusional therapy options.  6.  Abnormal mammogram.  Pt had screening mammogram done 03/20/2017 that showed a question of lesion in the right axilla and left breast.  She was recommended for left breast USN that was done on 04/09/2017 that showed  IMPRESSION: Benign sebaceous cyst in the right axilla.  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  RECOMMENDATION: Left breast ultrasound is recommended in 6 months.  She will be set  Up for bilateral diagnostic mammogram and left breast USN in 6 months for interval evaluation.    Interval History:  CLL, 13q deletion.  Pt was diagnosed in 11/2011.  Patients with 13q deletion as sole chromosomal abnormality found on FISH have longest survival time.  Monitored for treatment parameters, until 2016 when she was treated with Bendamustine/Rituxan x 4 cycles from 08/02/14-09/01/14 with normalization of blood counts and lymphadenopathy.  Chemo course was complicated by hospitalization for fever requiring Bendamustin dose reduction.  Was noted to be in remission in 12/2014.  Relapsed disease noted in 06/2015 with changes in WBCs/lymphocyte count and LDH.  Ibrutinib started in 06/2015. Pt  has history of questionable compliance on Imbruvica in the past.   Current Status:  Pt is seen today for follow-up to go over labs.  She is reporting nausea.  When questioned she has not been taking Ibrutinib as directed.      CLL (chronic lymphocytic leukemia) ([place])   12/16/2011 Initial Diagnosis     CLL (chronic lymphocytic leukemia), stage A ([place]), stage 0 ([place])      01/13/2013 Pathology Results    Monoclonal B cell population is detected with kapp light chain restriction, representing 82% of peripheral leukocytes. B cell population expresses CD19, CD20, CD11C, CD23, and aberrant CD5. No blasts.      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        03/30/2014 Cancer Staging    CLL Stage B ([place]), Stage I ([place]), now with doubling of WBC and approximate 6 months and widespread adenopathy, dropping hemoglobin, but not under 11 g/dL yet, fatigue and weakness      05/31/2014 Cancer Staging    Stage III by both [place] and [place] staging systems, HGB 10.6 g/dL.      07/07/2014 Imaging    CT CAP- extensive adenopathy w/in low neck, chest, abdomen, and pelvis.  Centrilobular emphysema. Nonspecific 5 mm LLL pulmonary nodule. Nonspecific focal left-sided pleural nodularity. 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      08/02/2014 - 11/02/2014 Chemotherapy    Bendamustine/Rituxan x 4 cycles with normalization of HGB and WBC and PLT count with disappearance of all symptoms and lymph nodes      09/07/2014 Adverse Reaction    Hospitalization with fever and chills, acute bronchitis, negative blood cultures. Comlicated by Levaquin skin rash.  Bendamustine dose reduced for next cycle.      09/08/2014 Echocardiogram    Left ventricular wall motion and contractility are within normal limits.  The estimated ejection fraction is greater than 65%.  Normal left ventricular diastolic filling is observed.      12/21/2014 Treatment Plan Change    Chemotherapy discontinued following 4 cycles of therapy due to tremendouns response and disappearance of all symptoms and lymph nodes.      12/21/2014 Remission         06/22/2015 Relapse/Recurrence    Per Dr. [name]: "Now relapsing changes in her WBC count,  lymphocyte count, LDH level. Before she gets symptomatic again, I think is worthy of starting her on Ibrutinib while she has a great performance status..."      06/29/2015 -  Chemotherapy    Ibrutinib initiated        Problem List Patient Active Problem List   Diagnosis Date Noted  . HTN (hypertension) [I10] 09/15/2015  . Hypokalemia [E87.6] 06/22/2015  . 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 Port-A-Cath in place [facility] 12/21/2014  . CLL (chronic lymphocytic leukemia) ([place]) [C91.90] 03/30/2014  . Essential hypertension [I10] 12/21/2011  . Hypercholesterolemia [E78.00] 12/21/2011  . Osteopenia [M85.80] 12/21/2011  . Tobacco abuse [Z72.0] 12/21/2011  . Alcohol dependence, continuous ([place]) [F10.20] 05/15/2004    Past Medical History Past Medical History:  Diagnosis Date  . Alcohol dependence, continuous ([place]) 05/15/2004   Overview:  ICD10 Conversion  . Anemia   . 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 CLL (chronic lymphocytic leukemia) ([place])   . Depression   . Diabetes mellitus without complication ([place])   . Hypertension   . Hypokalemia   . Leukemia ([place])   . Tobacco  abuse 12/21/2011   Quit in July 2016    Past Surgical History History reviewed. No pertinent surgical history.  Family History Family History  Problem Relation Age of Onset  . Heart failure Mother   . Diabetes Mother   . Hypertension Mother   . Heart failure Father      Social History  reports that she quit smoking about 2 years ago. Her smoking use included cigarettes. She has a 45.00 pack-year smoking history. She has quit using smokeless tobacco. She reports that she drinks about 1.2 oz of alcohol per week. She reports that she does not use drugs.  Medications  Current Outpatient Medications:  .  ALPRAZolam (XANAX) 0.25 MG tablet, Take one tablet by mouth every 6 to 8 hours as needed for anxiety., Disp: , Rfl:  .  atorvastatin (LIPITOR) 20 MG tablet, , Disp: , Rfl:  .  Ibrutinib  (IMBRUVICA) 420 MG TABS, Take 420 mg by mouth daily., Disp: 28 tablet, Rfl: 0 .  lidocaine-prilocaine (EMLA) cream, Apply 1 application topically as needed., Disp: 30 g, Rfl: 1 .  lisinopril-hydrochlorothiazide (PRINZIDE,ZESTORETIC) 20-12.5 MG tablet, Take 1 tablet by mouth daily. , Disp: , Rfl:  .  Multiple Vitamins-Minerals (THERA-M) TABS, Take by mouth., Disp: , Rfl:  .  omeprazole (PRILOSEC) 20 MG capsule, Take 1 capsule (20 mg total) by mouth daily., Disp: 30 capsule, Rfl: 2 .  ondansetron (ZOFRAN) 8 MG tablet, Take 1 tablet (8 mg total) by mouth every 8 (eight) hours as needed for nausea or vomiting., Disp: 30 tablet, Rfl: 3 .  potassium chloride SA (K-DUR,KLOR-CON) 20 MEQ tablet, Take 2 tablets (40 mEq total) by mouth 2 (two) times daily., Disp: 30 tablet, Rfl: 0 .  prochlorperazine (COMPAZINE) 10 MG tablet, Take 1 tablet (10 mg total) by mouth every 6 (six) hours as needed for nausea or vomiting., Disp: 30 tablet, Rfl: 3  Allergies Aspirin and Levaquin  [levofloxacin in d5w]  Review of Systems Review of Systems - Oncology ROS as per HPI otherwise 12 point ROS is negative.   Physical Exam  Vitals Wt Readings from Last 3 Encounters:  06/14/17 169 lb 12.8 oz (77 kg)  03/05/17 170 lb 4.8 oz (77.2 kg)  12/05/16 171 lb (77.6 kg)   Temp Readings from Last 3 Encounters:  06/14/17 98.5 F (36.9 C) (Oral)  04/16/17 97.7 F (36.5 C) (Oral)  03/05/17 98.3 F (36.8 C) (Oral)   BP Readings from Last 3 Encounters:  06/14/17 (!) 140/56  04/16/17 (!) 148/81  03/05/17 129/66   Pulse Readings from Last 3 Encounters:  06/14/17 68  04/16/17 66  03/05/17 83   Constitutional: Well-developed, well-nourished, and in no distress.   HENT: Head: Normocephalic and atraumatic.  Mouth/Throat: No oropharyngeal exudate. Mucosa moist. Eyes: Pupils are equal, round, and reactive to light. Conjunctivae are normal. No scleral icterus.  Neck: Normal range of motion. Neck supple. No JVD present.   Cardiovascular: Normal rate, regular rhythm and normal heart sounds.  Exam reveals no gallop and no friction rub.   No murmur heard. Pulmonary/Chest: Effort normal and breath sounds normal. No respiratory distress. No wheezes.No rales.  Abdominal: Soft. Bowel sounds are normal. No distension. There is no tenderness. There is no guarding.  Musculoskeletal: No edema or tenderness.  Lymphadenopathy: No cervical, axillary or supraclavicular adenopathy.  Neurological: Alert and oriented to person, place, and time. No cranial nerve deficit.  Skin: Skin is warm and dry. No rash noted. No erythema. No pallor.  Psychiatric: Affect  and judgment normal.   Labs Infusion on 06/14/2017  Component Date Value Ref Range Status  . WBC 06/14/2017 27.4* 4.0 - 10.5 K/uL Final  . RBC 06/14/2017 4.52  3.87 - 5.11 MIL/uL Final  . Hemoglobin 06/14/2017 12.3  12.0 - 15.0 g/dL Final  . HCT 06/14/2017 38.4  36.0 - 46.0 % Final  . MCV 06/14/2017 85.0  78.0 - 100.0 fL Final  . MCH 06/14/2017 27.2  26.0 - 34.0 pg Final  . MCHC 06/14/2017 32.0  30.0 - 36.0 g/dL Final  . RDW 06/14/2017 14.2  11.5 - 15.5 % Final  . Platelets 06/14/2017 262  150 - 400 K/uL Final  . Neutrophils Relative % 06/14/2017 12  % Final  . Lymphocytes Relative 06/14/2017 85  % Final  . Monocytes Relative 06/14/2017 2  % Final  . Eosinophils Relative 06/14/2017 1  % Final  . Basophils Relative 06/14/2017 0  % Final  . Neutro Abs 06/14/2017 3.3  1.7 - 7.7 K/uL Final  . Lymphs Abs 06/14/2017 23.3* 0.7 - 4.0 K/uL Final  . Monocytes Absolute 06/14/2017 0.5  0.1 - 1.0 K/uL Final  . Eosinophils Absolute 06/14/2017 0.3  0.0 - 0.7 K/uL Final  . Basophils Absolute 06/14/2017 0.0  0.0 - 0.1 K/uL Final  . WBC Morphology 06/14/2017 SMUDGE CELLS   Final   Comment: WHITE COUNT CONFIRMED ON SMEAR ATYPICAL LYMPHOCYTES ABSOLUTE LYMPHOCYTOSIS Performed at [facility], [address], [place], [place] [address]   . Sodium 06/14/2017 137  135 - 145 mmol/L  Final  . Potassium 06/14/2017 3.3* 3.5 - 5.1 mmol/L Final  . Chloride 06/14/2017 104  101 - 111 mmol/L Final  . CO2 06/14/2017 21* 22 - 32 mmol/L Final  . Glucose, Bld 06/14/2017 99  65 - 99 mg/dL Final  . BUN 06/14/2017 28* 6 - 20 mg/dL Final  . Creatinine, Ser 06/14/2017 0.97  0.44 - 1.00 mg/dL Final  . Calcium 06/14/2017 9.1  8.9 - 10.3 mg/dL Final  . Total Protein 06/14/2017 7.1  6.5 - 8.1 g/dL Final  . Albumin 06/14/2017 4.2  3.5 - 5.0 g/dL Final  . AST 06/14/2017 19  15 - 41 U/L Final  . ALT 06/14/2017 16  14 - 54 U/L Final  . Alkaline Phosphatase 06/14/2017 51  38 - 126 U/L Final  . Total Bilirubin 06/14/2017 1.1  0.3 - 1.2 mg/dL Final  . GFR calc non Af Amer 06/14/2017 60* >60 mL/min Final  . GFR calc Af Amer 06/14/2017 >60  >60 mL/min Final   Comment: (NOTE) The eGFR has been calculated using the CKD EPI equation. This calculation has not been validated in all clinical situations. eGFR's persistently <60 mL/min signify possible Chronic Kidney Disease.   Georgiann Hahn gap 06/14/2017 12  5 - 15 Final   Performed at [facility], [address] [place], [place] [address]  . LDH 06/14/2017 156  98 - 192 U/L Final   Performed at [facility], [address] [place], [place] [address]     Pathology Orders Placed This Encounter  Procedures  . CT Chest W Contrast    Standing Status:   Future    Standing Expiration Date:   06/14/2018    Order Specific Question:   If indicated for the ordered procedure, I authorize the administration of contrast media per Radiology protocol    Answer:   Yes    Order Specific Question:   Preferred imaging location?    Answer:   [facility]    Order Specific Question:   Radiology Contrast Protocol - do NOT remove file path    Answer:   \\[email or web address]  . CT Abdomen Pelvis W Contrast    Standing Status:   Future    Standing Expiration Date:   06/14/2018    Order Specific Question:   If indicated for the ordered  procedure, I authorize the administration of contrast media per Radiology protocol    Answer:   Yes    Order Specific Question:   Preferred imaging location?    Answer:   [facility]    Order Specific Question:   Is Oral Contrast requested for this exam?    Answer:   Per Radiology protocol    Order Specific Question:   Radiology Contrast Protocol - do NOT remove file path    Answer:   \\[email or web address]  . CBC with Differential/Platelet    Standing Status:   Future    Standing Expiration Date:   06/16/2018  . Comprehensive metabolic panel    Standing Status:   Future    Standing Expiration Date:   06/16/2018  . Lactate dehydrogenase    Standing Status:   Future    Standing Expiration Date:   06/16/2018  . Beta 2 microglobulin, serum    Standing Status:   Future    Standing Expiration Date:   06/16/2018       Zoila Shutter MD

## 2017-06-14 NOTE — Patient Instructions (Addendum)
[place] at [facility] Discharge Instructions  Today you saw Dr Walden Field. We will schedule you a follow up for 3 months. Please keep appointments and take medication as prescribed.   Thank you for choosing [place] at [facility] to provide your oncology and hematology care.  To afford each patient quality time with our provider, please arrive at least 15 minutes before your scheduled appointment time.   If you have a lab appointment with the [place] please come in thru the  Main Entrance and check in at the main information desk  You need to re-schedule your appointment should you arrive 10 or more minutes late.  We strive to give you quality time with our providers, and arriving late affects you and other patients whose appointments are after yours.  Also, if you no show three or more times for appointments you may be dismissed from the clinic at the providers discretion.     Again, thank you for choosing [facility].  Our hope is that these requests will decrease the amount of time that you wait before being seen by our physicians.       _____________________________________________________________  Should you have questions after your visit to [facility], please contact our office at [phone] between the hours of 8:30 a.m. and 4:30 p.m.  Voicemails left after 4:30 p.m. will not be returned until the following business day.  For prescription refill requests, have your pharmacy contact our office.       Resources For Cancer Patients and their Caregivers ? American Cancer Society: Can assist with transportation, wigs, general needs, runs Look Good Feel Better.        916-719-8408 ? Cancer Care: Provides financial assistance, online support groups, medication/co-pay assistance.  1-800-813-HOPE 239-178-0870) ? [place] Assists [place] cancer patients and their families through  emotional , educational and financial support.  442-820-4129 ? [place] DSS Where to apply for food stamps, Medicaid and utility assistance. 939-816-8751 ? [facility]: Transportation to medical appointments. 808-260-2888 ? Social Security Administration: May apply for disability if have a Stage IV cancer. 317-046-3060 754-011-9282 ? [employer], Disability and Transit Services: Assists with nutrition, care and transit needs. [place] Support Programs:   > Cancer Support Group  2nd Tuesday of the month 1pm-2pm, Journey Room   > Creative Journey  3rd Tuesday of the month 1130am-1pm, Journey Room

## 2017-06-26 ENCOUNTER — Ambulatory Visit (HOSPITAL_COMMUNITY)
Admission: RE | Admit: 2017-06-26 | Discharge: 2017-06-26 | Disposition: A | Discharge status: Home / Self Care | Payer: BLUE CROSS/BLUE SHIELD | Source: Ambulatory Visit | Attending: Internal Medicine | Admitting: Internal Medicine

## 2017-06-26 DIAGNOSIS — C9110 Chronic lymphocytic leukemia of B-cell type not having achieved remission: Secondary | ICD-10-CM

## 2017-06-26 DIAGNOSIS — C9190 Lymphoid leukemia, unspecified not having achieved remission: Secondary | ICD-10-CM | POA: Insufficient documentation

## 2017-06-26 DIAGNOSIS — R911 Solitary pulmonary nodule: Secondary | ICD-10-CM | POA: Diagnosis not present

## 2017-06-26 DIAGNOSIS — K769 Liver disease, unspecified: Secondary | ICD-10-CM | POA: Diagnosis not present

## 2017-06-26 MED ORDER — IOPAMIDOL (ISOVUE-300) INJECTION 61%
100.0000 mL | Freq: Once | INTRAVENOUS | Status: AC | PRN
  Administered 2017-06-26: 100 mL via INTRAVENOUS

## 2017-07-17 ENCOUNTER — Telehealth (HOSPITAL_COMMUNITY): Payer: Self-pay | Admitting: *Deleted

## 2017-07-17 ENCOUNTER — Other Ambulatory Visit (HOSPITAL_COMMUNITY): Payer: Self-pay

## 2017-07-17 DIAGNOSIS — C9110 Chronic lymphocytic leukemia of B-cell type not having achieved remission: Secondary | ICD-10-CM

## 2017-07-17 MED ORDER — IBRUTINIB 420 MG PO TABS: 420.0000 mg | ORAL_TABLET | Freq: Every day | ORAL | 0 refills | Status: DC

## 2017-07-17 NOTE — Telephone Encounter (Signed)
Received refill request from patients pharmacy for imbruvica. Reviewed with provider, chart checked and refilled.

## 2017-08-07 ENCOUNTER — Other Ambulatory Visit (HOSPITAL_COMMUNITY): Payer: BLUE CROSS/BLUE SHIELD

## 2017-08-07 ENCOUNTER — Ambulatory Visit (HOSPITAL_COMMUNITY): Payer: BLUE CROSS/BLUE SHIELD | Admitting: Internal Medicine

## 2017-08-28 ENCOUNTER — Inpatient Hospital Stay (HOSPITAL_COMMUNITY): Payer: BLUE CROSS/BLUE SHIELD | Attending: Hematology

## 2017-08-28 DIAGNOSIS — R918 Other nonspecific abnormal finding of lung field: Secondary | ICD-10-CM | POA: Diagnosis not present

## 2017-08-28 DIAGNOSIS — Z79899 Other long term (current) drug therapy: Secondary | ICD-10-CM | POA: Diagnosis not present

## 2017-08-28 DIAGNOSIS — Z87891 Personal history of nicotine dependence: Secondary | ICD-10-CM | POA: Diagnosis not present

## 2017-08-28 DIAGNOSIS — R197 Diarrhea, unspecified: Secondary | ICD-10-CM | POA: Insufficient documentation

## 2017-08-28 DIAGNOSIS — Z9221 Personal history of antineoplastic chemotherapy: Secondary | ICD-10-CM | POA: Insufficient documentation

## 2017-08-28 DIAGNOSIS — L723 Sebaceous cyst: Secondary | ICD-10-CM | POA: Insufficient documentation

## 2017-08-28 DIAGNOSIS — C9190 Lymphoid leukemia, unspecified not having achieved remission: Secondary | ICD-10-CM | POA: Insufficient documentation

## 2017-08-28 DIAGNOSIS — F1020 Alcohol dependence, uncomplicated: Secondary | ICD-10-CM | POA: Diagnosis not present

## 2017-08-28 DIAGNOSIS — F329 Major depressive disorder, single episode, unspecified: Secondary | ICD-10-CM | POA: Diagnosis not present

## 2017-08-28 DIAGNOSIS — E119 Type 2 diabetes mellitus without complications: Secondary | ICD-10-CM | POA: Insufficient documentation

## 2017-08-28 DIAGNOSIS — R110 Nausea: Secondary | ICD-10-CM | POA: Diagnosis not present

## 2017-08-28 DIAGNOSIS — E7800 Pure hypercholesterolemia, unspecified: Secondary | ICD-10-CM | POA: Insufficient documentation

## 2017-08-28 DIAGNOSIS — E876 Hypokalemia: Secondary | ICD-10-CM | POA: Insufficient documentation

## 2017-08-28 DIAGNOSIS — M8580 Other specified disorders of bone density and structure, unspecified site: Secondary | ICD-10-CM | POA: Diagnosis not present

## 2017-08-28 DIAGNOSIS — I10 Essential (primary) hypertension: Secondary | ICD-10-CM | POA: Insufficient documentation

## 2017-08-28 DIAGNOSIS — C9110 Chronic lymphocytic leukemia of B-cell type not having achieved remission: Secondary | ICD-10-CM

## 2017-08-28 DIAGNOSIS — Z452 Encounter for adjustment and management of vascular access device: Secondary | ICD-10-CM | POA: Insufficient documentation

## 2017-08-28 LAB — COMPREHENSIVE METABOLIC PANEL
ALBUMIN: 4.3 g/dL (ref 3.5–5.0)
ALK PHOS: 38 U/L (ref 38–126)
ALT: 22 U/L (ref 0–44)
ANION GAP: 10 (ref 5–15)
AST: 26 U/L (ref 15–41)
BILIRUBIN TOTAL: 0.9 mg/dL (ref 0.3–1.2)
BUN: 28 mg/dL — ABNORMAL HIGH (ref 8–23)
CALCIUM: 9.3 mg/dL (ref 8.9–10.3)
CO2: 24 mmol/L (ref 22–32)
Chloride: 107 mmol/L (ref 98–111)
Creatinine, Ser: 1.19 mg/dL — ABNORMAL HIGH (ref 0.44–1.00)
GFR calc Af Amer: 54 mL/min — ABNORMAL LOW (ref >60)
GFR calc non Af Amer: 47 mL/min — ABNORMAL LOW (ref >60)
GLUCOSE: 100 mg/dL — AB (ref 70–99)
Potassium: 3.5 mmol/L (ref 3.5–5.1)
Sodium: 141 mmol/L (ref 135–145)
TOTAL PROTEIN: 7.4 g/dL (ref 6.5–8.1)

## 2017-08-28 LAB — CBC WITH DIFFERENTIAL/PLATELET
BASOS ABS: 0.1 10*3/uL (ref 0.0–0.1)
Basophils Relative: 0 %
Eosinophils Absolute: 0.4 10*3/uL (ref 0.0–0.7)
Eosinophils Relative: 3 %
HEMATOCRIT: 38.5 % (ref 36.0–46.0)
HEMOGLOBIN: 12.7 g/dL (ref 12.0–15.0)
LYMPHS ABS: 11.1 10*3/uL — AB (ref 0.7–4.0)
LYMPHS PCT: 74 %
MCH: 28.5 pg (ref 26.0–34.0)
MCHC: 33 g/dL (ref 30.0–36.0)
MCV: 86.5 fL (ref 78.0–100.0)
MONO ABS: 0.6 10*3/uL (ref 0.1–1.0)
MONOS PCT: 4 %
NEUTROS ABS: 2.8 10*3/uL (ref 1.7–7.7)
Neutrophils Relative %: 19 %
Platelets: 268 10*3/uL (ref 150–400)
RBC: 4.45 MIL/uL (ref 3.87–5.11)
RDW: 14.2 % (ref 11.5–15.5)
WBC: 14.9 10*3/uL — ABNORMAL HIGH (ref 4.0–10.5)

## 2017-08-28 LAB — LACTATE DEHYDROGENASE: LDH: 192 U/L (ref 98–192)

## 2017-08-29 LAB — BETA 2 MICROGLOBULIN, SERUM: BETA 2 MICROGLOBULIN: 1.9 mg/L (ref 0.6–2.4)

## 2017-09-06 ENCOUNTER — Other Ambulatory Visit (HOSPITAL_COMMUNITY): Payer: Self-pay | Admitting: *Deleted

## 2017-09-06 DIAGNOSIS — C9110 Chronic lymphocytic leukemia of B-cell type not having achieved remission: Secondary | ICD-10-CM

## 2017-09-06 MED ORDER — PROCHLORPERAZINE MALEATE 10 MG PO TABS: 10.0000 mg | ORAL_TABLET | Freq: Four times a day (QID) | ORAL | 3 refills | Status: DC | PRN

## 2017-09-06 MED ORDER — IBRUTINIB 420 MG PO TABS: 420.0000 mg | ORAL_TABLET | Freq: Every day | ORAL | 0 refills | Status: DC

## 2017-09-06 MED ORDER — ONDANSETRON HCL 8 MG PO TABS: 8.0000 mg | ORAL_TABLET | Freq: Three times a day (TID) | ORAL | 3 refills | Status: DC | PRN

## 2017-09-06 NOTE — Telephone Encounter (Signed)
I called and spoke with patient and she reports compliance with medication.  She works third shift so she takes it on her "lunch break" which she reports is usually around 2am.  She states that she is nauseated and vomits often when she wakes up in the afternoons.  She states that she doesn't have anything for nausea at home.  Refills sent to pharmacy for Zofran and compazine.  She verbalizes understanding of how to take medication.  I explained the importance of taking her Imbruvica daily and not to miss any doses.  She verbalizes understanding and states "I have been taking my medicine".  I advised patient of her upcoming appointments.

## 2017-09-06 NOTE — Telephone Encounter (Signed)
Chart reviewed and per Dr. Walden Field last note, imbruvica refilled.

## 2017-09-16 ENCOUNTER — Ambulatory Visit (HOSPITAL_COMMUNITY): Payer: BLUE CROSS/BLUE SHIELD | Admitting: Internal Medicine

## 2017-09-18 ENCOUNTER — Inpatient Hospital Stay (HOSPITAL_BASED_OUTPATIENT_CLINIC_OR_DEPARTMENT_OTHER): Payer: BLUE CROSS/BLUE SHIELD | Admitting: Internal Medicine

## 2017-09-18 ENCOUNTER — Encounter (HOSPITAL_COMMUNITY): Payer: Self-pay | Admitting: Internal Medicine

## 2017-09-18 ENCOUNTER — Other Ambulatory Visit: Payer: Self-pay

## 2017-09-18 ENCOUNTER — Ambulatory Visit (HOSPITAL_COMMUNITY): Payer: BLUE CROSS/BLUE SHIELD | Admitting: Internal Medicine

## 2017-09-18 ENCOUNTER — Inpatient Hospital Stay (HOSPITAL_COMMUNITY): Payer: BLUE CROSS/BLUE SHIELD

## 2017-09-18 DIAGNOSIS — E7800 Pure hypercholesterolemia, unspecified: Secondary | ICD-10-CM

## 2017-09-18 DIAGNOSIS — Z79899 Other long term (current) drug therapy: Secondary | ICD-10-CM

## 2017-09-18 DIAGNOSIS — C9190 Lymphoid leukemia, unspecified not having achieved remission: Secondary | ICD-10-CM | POA: Diagnosis not present

## 2017-09-18 DIAGNOSIS — R110 Nausea: Secondary | ICD-10-CM

## 2017-09-18 DIAGNOSIS — F329 Major depressive disorder, single episode, unspecified: Secondary | ICD-10-CM

## 2017-09-18 DIAGNOSIS — Z452 Encounter for adjustment and management of vascular access device: Secondary | ICD-10-CM

## 2017-09-18 DIAGNOSIS — R1115 Cyclical vomiting syndrome unrelated to migraine: Secondary | ICD-10-CM

## 2017-09-18 DIAGNOSIS — R918 Other nonspecific abnormal finding of lung field: Secondary | ICD-10-CM | POA: Diagnosis not present

## 2017-09-18 DIAGNOSIS — Z87891 Personal history of nicotine dependence: Secondary | ICD-10-CM

## 2017-09-18 DIAGNOSIS — M8580 Other specified disorders of bone density and structure, unspecified site: Secondary | ICD-10-CM

## 2017-09-18 DIAGNOSIS — Z9221 Personal history of antineoplastic chemotherapy: Secondary | ICD-10-CM

## 2017-09-18 DIAGNOSIS — L723 Sebaceous cyst: Secondary | ICD-10-CM

## 2017-09-18 DIAGNOSIS — R197 Diarrhea, unspecified: Secondary | ICD-10-CM

## 2017-09-18 DIAGNOSIS — F1020 Alcohol dependence, uncomplicated: Secondary | ICD-10-CM

## 2017-09-18 DIAGNOSIS — E876 Hypokalemia: Secondary | ICD-10-CM

## 2017-09-18 DIAGNOSIS — E119 Type 2 diabetes mellitus without complications: Secondary | ICD-10-CM

## 2017-09-18 DIAGNOSIS — I10 Essential (primary) hypertension: Secondary | ICD-10-CM

## 2017-09-18 MED ORDER — SODIUM CHLORIDE 0.9% FLUSH
10.0000 mL | INTRAVENOUS | Status: DC | PRN
  Administered 2017-09-18: 10 mL via INTRAVENOUS
  Filled 2017-09-18: qty 10

## 2017-09-18 MED ORDER — LIDOCAINE-PRILOCAINE 2.5-2.5 % EX CREA: 1.0000 "application " | TOPICAL_CREAM | CUTANEOUS | 1 refills | Status: AC | PRN

## 2017-09-18 MED ORDER — HEPARIN SOD (PORK) LOCK FLUSH 100 UNIT/ML IV SOLN
500.0000 [IU] | Freq: Once | INTRAVENOUS | Status: AC
  Administered 2017-09-18: 500 [IU] via INTRAVENOUS

## 2017-09-18 NOTE — Progress Notes (Signed)
Diagnosis Abnormal findings on diagnostic imaging of lung - Plan: CT CHEST W CONTRAST, CBC with Differential/Platelet, Comprehensive metabolic panel, Lactate dehydrogenase  Staging Cancer Staging No matching staging information was found for the patient.  Assessment and Plan:  1.CLL, 13q deletion.  Pt was diagnosed in 11/2011.  Patients with 13q deletion as sole chromosomal abnormality found on FISH have longest survival time.  Monitored for treatment parameters, until 2016 when she was treated with Bendamustine/Rituxan x 4 cycles from 08/02/14-09/01/14 with normalization of blood counts and lymphadenopathy.  Chemo course was complicated by hospitalization for fever requiring Bendamustin dose reduction.  Was noted to be in remission in 12/2014.  Relapsed disease noted in 06/2015 with changes in WBCs/lymphocyte count and LDH.  Ibrutinib started in 06/2015. Pt has history of questionable compliance on Imbruvica in the past.     Labs done 08/28/2017 reviewed with pt and shows WBC 14.9 Hb 12.7 plts 268,000.  Chemistries WNL.  WBC is improved from labs done 05/2017 when WBC was 27.4.  She is currently on  Imbruvica 420 mg daily.  If diarrhea continues, pt has option of  decreasing dose to 280 mg daily with close follow-up of labs.     CTT scans were done at outside facility on 07/07/2014 and showed extensive adenopathy in the neck chest abdomen and pelvis consistent with leukemia/lymphoma.  She also had evidence of emphysema with a nonspecific 5 mm left lower lobe pulmonary nodule.    She will be set up for CT CAP for follow-up imaging.  She will RTC in 09/2017 to go over results.  I have stressed to her the need for compliance with therapy and if she is unable to be compliant may have to consider looking at infusional therapies.  2.  Pulmonary nodule.  This was reported on CAT scan done at an outside facility Jul 07, 2014.  Nodule measured 5 mm.  She has a history of smoking but reports she has quit smoking.   She is set up for CT of the chest in 09/2017.    3.  Hypertension.  Blood pressure is 132/60.  Follow-up with PCP.    4.  Nausea/diarrhea.   Pt should continue imodium and antiemetics as recommended.  She is referred to GI for evaluation.  Pt is also set up for scans for interval evaluation.    5.  Compliance with therapy.  Pt reports she is now taking medication as directed.  Labs improved today.  I have discussed with her if ongoing diarrhea may consider decrease in dose to 280 mg daily with close lab monitoring. She will have repeat labs in 1 month and follow-up to go over scans.    6.  Abnormal mammogram.  Pt had screening mammogram done 03/20/2017 that showed a question of lesion in the right axilla and left breast.  She was recommended for left breast USN that was done on 04/09/2017 that showed  IMPRESSION: Benign sebaceous cyst in the right axilla.  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  RECOMMENDATION: Left breast ultrasound is recommended in 6 months.  She is scheduled for bilateral diagnostic mammogram and left breast USN in 09/2017.  She will be seen for follow-up to go over results.    Interval History:  Historical data obtained from the note dated 06/14/2017.  CLL, 13q deletion.  Pt was diagnosed in 11/2011.  Patients with 13q deletion as sole chromosomal abnormality found on FISH have longest survival time.  Monitored for treatment parameters, until 2016 when  she was treated with Bendamustine/Rituxan x 4 cycles from 08/02/14-09/01/14 with normalization of blood counts and lymphadenopathy.  Chemo course was complicated by hospitalization for fever requiring Bendamustin dose reduction.  Was noted to be in remission in 12/2014.  Relapsed disease noted in 06/2015 with changes in WBCs/lymphocyte count and LDH.  Ibrutinib started in 06/2015. Pt has history of questionable compliance on Imbruvica in the past.   Current Status:  Pt is seen today for follow-up to go over labs.  She  reports nausea and diarrhea.  She is taking antiemetics and imodium.      CLL (chronic lymphocytic leukemia) ([place])   12/16/2011 Initial Diagnosis    CLL (chronic lymphocytic leukemia), stage A ([place]), stage 0 ([place])      01/13/2013 Pathology Results    Monoclonal B cell population is detected with kapp light chain restriction, representing 82% of peripheral leukocytes. B cell population expresses CD19, CD20, CD11C, CD23, and aberrant CD5. No blasts.      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        03/30/2014 Cancer Staging    CLL Stage B ([place]), Stage I ([place]ai), now with doubling of WBC and approximate 6 months and widespread adenopathy, dropping hemoglobin, but not under 11 g/dL yet, fatigue and weakness      05/31/2014 Cancer Staging    Stage III by both [place] and Rai staging systems, HGB 10.6 g/dL.      07/07/2014 Imaging    CT CAP- extensive adenopathy w/in low neck, chest, abdomen, and pelvis.  Centrilobular emphysema. Nonspecific 5 mm LLL pulmonary nodule. Nonspecific focal left-sided pleural nodularity. 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      08/02/2014 - 11/02/2014 Chemotherapy    Bendamustine/Rituxan x 4 cycles with normalization of HGB and WBC and PLT count with disappearance of all symptoms and lymph nodes      09/07/2014 Adverse Reaction    Hospitalization with fever and chills, acute bronchitis, negative blood cultures. Comlicated by Levaquin skin rash.  Bendamustine dose reduced for next cycle.      09/08/2014 Echocardiogram    Left ventricular wall motion and contractility are within normal limits.  The estimated ejection fraction is greater than 65%.  Normal left ventricular diastolic filling is observed.      12/21/2014 Treatment Plan Change    Chemotherapy discontinued following 4 cycles of therapy due to tremendouns response and disappearance of all symptoms and  lymph nodes.      12/21/2014 Remission         06/22/2015 Relapse/Recurrence    Per Dr. [name]: "Now relapsing changes in her WBC count, lymphocyte count, LDH level. Before she gets symptomatic again, I think is worthy of starting her on Ibrutinib while she has a great performance status..."      06/29/2015 -  Chemotherapy    Ibrutinib initiated        Problem List Patient Active Problem List   Diagnosis Date Noted  . HTN (hypertension) [I10] 09/15/2015  . Hypokalemia [E87.6] 06/22/2015  . 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 Port-A-Cath in place [facility] 12/21/2014  . CLL (chronic lymphocytic leukemia) ([place]) [C91.90] 03/30/2014  . Essential hypertension [I10] 12/21/2011  . Hypercholesterolemia [E78.00] 12/21/2011  . Osteopenia [M85.80] 12/21/2011  . Tobacco abuse [Z72.0] 12/21/2011  . Alcohol dependence, continuous ([place]) [F10.20] 05/15/2004    Past Medical History Past Medical History:  Diagnosis Date  . Alcohol dependence, continuous ([place])  05/15/2004   Overview:  ICD10 Conversion  . Anemia   . 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 CLL (chronic lymphocytic leukemia) ([place])   . Depression   . Diabetes mellitus without complication ([place])   . Hypertension   . Hypokalemia   . Leukemia ([place])   . Tobacco abuse 12/21/2011   Quit in July 2016    Past Surgical History History reviewed. No pertinent surgical history.  Family History Family History  Problem Relation Age of Onset  . Heart failure Mother   . Diabetes Mother   . Hypertension Mother   . Heart failure Father      Social History  reports that she quit smoking about 3 years ago. Her smoking use included cigarettes. She has a 45.00 pack-year smoking history. She has quit using smokeless tobacco. She reports that she drinks about 1.2 oz of alcohol per week. She reports that she does not use drugs.  Medications  Current Outpatient Medications:  .  ALPRAZolam (XANAX) 0.25 MG tablet, Take one  tablet by mouth every 6 to 8 hours as needed for anxiety., Disp: , Rfl:  .  atorvastatin (LIPITOR) 20 MG tablet, , Disp: , Rfl:  .  Ibrutinib (IMBRUVICA) 420 MG TABS, Take 420 mg by mouth daily., Disp: 28 tablet, Rfl: 0 .  lisinopril-hydrochlorothiazide (PRINZIDE,ZESTORETIC) 20-12.5 MG tablet, Take 1 tablet by mouth daily. , Disp: , Rfl:  .  Multiple Vitamins-Minerals (THERA-M) TABS, Take by mouth., Disp: , Rfl:  .  omeprazole (PRILOSEC) 20 MG capsule, Take 1 capsule (20 mg total) by mouth daily., Disp: 30 capsule, Rfl: 2 .  ondansetron (ZOFRAN) 8 MG tablet, Take 1 tablet (8 mg total) by mouth every 8 (eight) hours as needed for nausea or vomiting., Disp: 30 tablet, Rfl: 3 .  potassium chloride SA (K-DUR,KLOR-CON) 20 MEQ tablet, Take 2 tablets (40 mEq total) by mouth 2 (two) times daily., Disp: 30 tablet, Rfl: 0 .  prochlorperazine (COMPAZINE) 10 MG tablet, Take 1 tablet (10 mg total) by mouth every 6 (six) hours as needed for nausea or vomiting., Disp: 30 tablet, Rfl: 3 .  fenofibrate (TRICOR) 145 MG tablet, Take 145 mg by mouth daily., Disp: , Rfl: 1 .  lidocaine-prilocaine (EMLA) cream, Apply 1 application topically as needed., Disp: 30 g, Rfl: 1 No current facility-administered medications for this visit.   Facility-Administered Medications Ordered in Other Visits:  .  sodium chloride flush (NS) 0.9 % injection 10 mL, 10 mL, Intravenous, PRN, Kayleb Warshaw, MD, 10 mL at 09/18/17 1030  Allergies Aspirin and Levaquin  [levofloxacin in d5w]  Review of Systems Review of Systems - Oncology ROS negative other than nausea and diarrhea   Physical Exam  Vitals Wt Readings from Last 3 Encounters:  09/18/17 170 lb 11.2 oz (77.4 kg)  06/14/17 169 lb 12.8 oz (77 kg)  03/05/17 170 lb 4.8 oz (77.2 kg)   Temp Readings from Last 3 Encounters:  09/18/17 97.8 F (36.6 C) (Oral)  06/14/17 98.5 F (36.9 C) (Oral)  04/16/17 97.7 F (36.5 C) (Oral)   BP Readings from Last 3 Encounters:  09/18/17  132/60  06/14/17 (!) 140/56  04/16/17 (!) 148/81   Pulse Readings from Last 3 Encounters:  09/18/17 68  06/14/17 68  04/16/17 66   Constitutional: Well-developed, well-nourished, and in no distress.   HENT: Head: Normocephalic and atraumatic.  Mouth/Throat: No oropharyngeal exudate. Mucosa moist. Eyes: Pupils are equal, round, and reactive to light. Conjunctivae are normal. No scleral icterus.  Neck: Normal range of motion. Neck supple. No JVD present.  Cardiovascular: Normal rate, regular rhythm and normal heart sounds.  Exam reveals no gallop and no friction rub.   No murmur heard. Pulmonary/Chest: Effort normal and breath sounds normal. No respiratory distress. No wheezes.No rales.  Abdominal: Soft. Bowel sounds are normal. Tender in epigastric region.  No guarding.   Musculoskeletal: No edema or tenderness.  Lymphadenopathy: No cervical, axillary or supraclavicular adenopathy.  Neurological: Alert and oriented to person, place, and time. No cranial nerve deficit.  Skin: Skin is warm and dry. No rash noted. No erythema. No pallor.  Psychiatric: Affect and judgment normal.   Labs No visits with results within 3 Day(s) from this visit.  Latest known visit with results is:  Appointment on 08/28/2017  Component Date Value Ref Range Status  . WBC 08/28/2017 14.9* 4.0 - 10.5 K/uL Final  . RBC 08/28/2017 4.45  3.87 - 5.11 MIL/uL Final  . Hemoglobin 08/28/2017 12.7  12.0 - 15.0 g/dL Final  . HCT 08/28/2017 38.5  36.0 - 46.0 % Final  . MCV 08/28/2017 86.5  78.0 - 100.0 fL Final  . MCH 08/28/2017 28.5  26.0 - 34.0 pg Final  . MCHC 08/28/2017 33.0  30.0 - 36.0 g/dL Final  . RDW 08/28/2017 14.2  11.5 - 15.5 % Final  . Platelets 08/28/2017 268  150 - 400 K/uL Final  . Neutrophils Relative % 08/28/2017 19  % Final  . Neutro Abs 08/28/2017 2.8  1.7 - 7.7 K/uL Final  . Lymphocytes Relative 08/28/2017 74  % Final  . Lymphs Abs 08/28/2017 11.1* 0.7 - 4.0 K/uL Final  . Monocytes Relative  08/28/2017 4  % Final  . Monocytes Absolute 08/28/2017 0.6  0.1 - 1.0 K/uL Final  . Eosinophils Relative 08/28/2017 3  % Final  . Eosinophils Absolute 08/28/2017 0.4  0.0 - 0.7 K/uL Final  . Basophils Relative 08/28/2017 0  % Final  . Basophils Absolute 08/28/2017 0.1  0.0 - 0.1 K/uL Final  . WBC Morphology 08/28/2017 ABSOLUTE LYMPHOCYTOSIS   Final   Comment: ATYPICAL LYMPHOCYTES SMUDGE CELLS Performed at [facility], [address] [place], [place] [address]   . Sodium 08/28/2017 141  135 - 145 mmol/L Final  . Potassium 08/28/2017 3.5  3.5 - 5.1 mmol/L Final  . Chloride 08/28/2017 107  98 - 111 mmol/L Final   Please note change in reference range.  . CO2 08/28/2017 24  22 - 32 mmol/L Final  . Glucose, Bld 08/28/2017 100* 70 - 99 mg/dL Final   Please note change in reference range.  . BUN 08/28/2017 28* 8 - 23 mg/dL Final   Please note change in reference range.  . Creatinine, Ser 08/28/2017 1.19* 0.44 - 1.00 mg/dL Final  . Calcium 08/28/2017 9.3  8.9 - 10.3 mg/dL Final  . Total Protein 08/28/2017 7.4  6.5 - 8.1 g/dL Final  . Albumin 08/28/2017 4.3  3.5 - 5.0 g/dL Final  . AST 08/28/2017 26  15 - 41 U/L Final  . ALT 08/28/2017 22  0 - 44 U/L Final   Please note change in reference range.  . Alkaline Phosphatase 08/28/2017 38  38 - 126 U/L Final  . Total Bilirubin 08/28/2017 0.9  0.3 - 1.2 mg/dL Final  . GFR calc non Af Amer 08/28/2017 47* >60 mL/min Final  . GFR calc Af Amer 08/28/2017 54* >60 mL/min Final   Comment: (NOTE) The eGFR has been calculated using the CKD EPI equation. This  calculation has not been validated in all clinical situations. eGFR's persistently <60 mL/min signify possible Chronic Kidney Disease.   Georgiann Hahn gap 08/28/2017 10  5 - 15 Final   Performed at [facility], [address] [place], [place] [address]  . LDH 08/28/2017 192  98 - 192 U/L Final   Performed at [facility], [address] [place], [place] [address]  . Beta-2 Microglobulin 08/28/2017  1.9  0.6 - 2.4 mg/L Final   Comment: (NOTE) Siemens Immulite 2000 Immunochemiluminometric assay (ICMA) Values obtained with different assay methods or kits cannot be used interchangeably. Results cannot be interpreted as absolute evidence of the presence or absence of malignant disease. Performed At: [facility] [place], [place] [id number] Rush Farmer MD [id number]      Pathology Orders Placed This Encounter  Procedures  . CT CHEST W CONTRAST    Standing Status:   Future    Standing Expiration Date:   09/18/2018    Order Specific Question:   If indicated for the ordered procedure, I authorize the administration of contrast media per Radiology protocol    Answer:   Yes    Order Specific Question:   Preferred imaging location?    Answer:   [facility]    Order Specific Question:   Radiology Contrast Protocol - do NOT remove file path    Answer:   [email or web address]  . CBC with Differential/Platelet    Standing Status:   Future    Standing Expiration Date:   09/19/2018  . Comprehensive metabolic panel    Standing Status:   Future    Standing Expiration Date:   09/19/2018  . Lactate dehydrogenase    Standing Status:   Future    Standing Expiration Date:   09/19/2018       Zoila Shutter MD

## 2017-09-18 NOTE — Patient Instructions (Signed)
[facility] at [facility] Discharge Instructions  Portacath flushed today per protocol. Follow-up as scheduled. Call clinic for any questions or concerns   Thank you for choosing [facility] at [facility] to provide your oncology and hematology care.  To afford each patient quality time with our provider, please arrive at least 15 minutes before your scheduled appointment time.   If you have a lab appointment with the Cancer Center please come in thru the  Main Entrance and check in at the main information desk  You need to re-schedule your appointment should you arrive 10 or more minutes late.  We strive to give you quality time with our providers, and arriving late affects you and other patients whose appointments are after yours.  Also, if you no show three or more times for appointments you may be dismissed from the clinic at the providers discretion.     Again, thank you for choosing [facility].  Our hope is that these requests will decrease the amount of time that you wait before being seen by our physicians.       _____________________________________________________________  Should you have questions after your visit to [facility], please contact our office at [phone] between the hours of 8:00 a.m. and 4:30 p.m.  Voicemails left after 4:00 p.m. will not be returned until the following business day.  For prescription refill requests, have your pharmacy contact our office and allow 72 hours.    Cancer Center Support Programs:   > Cancer Support Group  2nd Tuesday of the month 1pm-2pm, Journey Room   

## 2017-09-18 NOTE — Progress Notes (Signed)
Brittany Rowland tolerated portacath flush well without complaints or incident. Port accessed with 20 gauge needle with blood return noted then flushed with 10 ml NS and 5 ml Heparin easily per protocol then de-accessed. VSS Pt discharged self ambulatory in satisfactory condition

## 2017-09-18 NOTE — Patient Instructions (Signed)
[facility] at [facility] Discharge Instructions  Today you saw Dr. Higgs   Thank you for choosing [facility] at [facility] to provide your oncology and hematology care.  To afford each patient quality time with our provider, please arrive at least 15 minutes before your scheduled appointment time.   If you have a lab appointment with the Cancer Center please come in thru the  Main Entrance and check in at the main information desk  You need to re-schedule your appointment should you arrive 10 or more minutes late.  We strive to give you quality time with our providers, and arriving late affects you and other patients whose appointments are after yours.  Also, if you no show three or more times for appointments you may be dismissed from the clinic at the providers discretion.     Again, thank you for choosing [facility].  Our hope is that these requests will decrease the amount of time that you wait before being seen by our physicians.       _____________________________________________________________  Should you have questions after your visit to [facility], please contact our office at [phone] between the hours of 8:00 a.m. and 4:30 p.m.  Voicemails left after 4:00 p.m. will not be returned until the following business day.  For prescription refill requests, have your pharmacy contact our office and allow 72 hours.    Cancer Center Support Programs:   > Cancer Support Group  2nd Tuesday of the month 1pm-2pm, Journey Room   

## 2017-09-19 ENCOUNTER — Encounter: Payer: Self-pay | Admitting: Gastroenterology

## 2017-10-07 ENCOUNTER — Other Ambulatory Visit (HOSPITAL_COMMUNITY): Payer: Self-pay | Admitting: Internal Medicine

## 2017-10-07 DIAGNOSIS — R928 Other abnormal and inconclusive findings on diagnostic imaging of breast: Secondary | ICD-10-CM

## 2017-10-08 ENCOUNTER — Ambulatory Visit (HOSPITAL_COMMUNITY)
Admission: RE | Admit: 2017-10-08 | Discharge: 2017-10-08 | Disposition: A | Discharge status: Home / Self Care | Payer: BLUE CROSS/BLUE SHIELD | Source: Ambulatory Visit | Attending: Internal Medicine | Admitting: Internal Medicine

## 2017-10-08 ENCOUNTER — Inpatient Hospital Stay (HOSPITAL_COMMUNITY): Payer: BLUE CROSS/BLUE SHIELD | Attending: Hematology

## 2017-10-08 ENCOUNTER — Ambulatory Visit (HOSPITAL_COMMUNITY)
Admission: RE | Admit: 2017-10-08 | Discharge: 2017-10-08 | Disposition: A | Discharge status: Home / Self Care | Payer: BLUE CROSS/BLUE SHIELD | Source: Ambulatory Visit | Attending: Adult Health | Admitting: Adult Health

## 2017-10-08 DIAGNOSIS — R918 Other nonspecific abnormal finding of lung field: Secondary | ICD-10-CM | POA: Insufficient documentation

## 2017-10-08 DIAGNOSIS — N6321 Unspecified lump in the left breast, upper outer quadrant: Secondary | ICD-10-CM | POA: Diagnosis not present

## 2017-10-08 DIAGNOSIS — C9190 Lymphoid leukemia, unspecified not having achieved remission: Secondary | ICD-10-CM | POA: Insufficient documentation

## 2017-10-08 DIAGNOSIS — Z79899 Other long term (current) drug therapy: Secondary | ICD-10-CM | POA: Diagnosis not present

## 2017-10-08 DIAGNOSIS — R5383 Other fatigue: Secondary | ICD-10-CM | POA: Insufficient documentation

## 2017-10-08 DIAGNOSIS — R110 Nausea: Secondary | ICD-10-CM | POA: Diagnosis not present

## 2017-10-08 DIAGNOSIS — E7800 Pure hypercholesterolemia, unspecified: Secondary | ICD-10-CM | POA: Insufficient documentation

## 2017-10-08 DIAGNOSIS — J432 Centrilobular emphysema: Secondary | ICD-10-CM | POA: Diagnosis not present

## 2017-10-08 DIAGNOSIS — I10 Essential (primary) hypertension: Secondary | ICD-10-CM | POA: Diagnosis not present

## 2017-10-08 DIAGNOSIS — I2510 Atherosclerotic heart disease of native coronary artery without angina pectoris: Secondary | ICD-10-CM | POA: Diagnosis not present

## 2017-10-08 DIAGNOSIS — Z9221 Personal history of antineoplastic chemotherapy: Secondary | ICD-10-CM | POA: Insufficient documentation

## 2017-10-08 DIAGNOSIS — R928 Other abnormal and inconclusive findings on diagnostic imaging of breast: Secondary | ICD-10-CM

## 2017-10-08 DIAGNOSIS — R197 Diarrhea, unspecified: Secondary | ICD-10-CM | POA: Diagnosis not present

## 2017-10-08 DIAGNOSIS — F329 Major depressive disorder, single episode, unspecified: Secondary | ICD-10-CM | POA: Insufficient documentation

## 2017-10-08 DIAGNOSIS — M8580 Other specified disorders of bone density and structure, unspecified site: Secondary | ICD-10-CM | POA: Diagnosis not present

## 2017-10-08 DIAGNOSIS — L723 Sebaceous cyst: Secondary | ICD-10-CM | POA: Insufficient documentation

## 2017-10-08 DIAGNOSIS — Z87891 Personal history of nicotine dependence: Secondary | ICD-10-CM | POA: Insufficient documentation

## 2017-10-08 DIAGNOSIS — E119 Type 2 diabetes mellitus without complications: Secondary | ICD-10-CM | POA: Insufficient documentation

## 2017-10-08 LAB — CBC WITH DIFFERENTIAL/PLATELET
BASOS PCT: 1 %
Basophils Absolute: 0.1 10*3/uL (ref 0.0–0.1)
EOS PCT: 5 %
Eosinophils Absolute: 0.5 10*3/uL (ref 0.0–0.7)
HEMATOCRIT: 38.4 % (ref 36.0–46.0)
HEMOGLOBIN: 12.5 g/dL (ref 12.0–15.0)
LYMPHS PCT: 74 %
Lymphs Abs: 7.6 10*3/uL — ABNORMAL HIGH (ref 0.7–4.0)
MCH: 28.1 pg (ref 26.0–34.0)
MCHC: 32.6 g/dL (ref 30.0–36.0)
MCV: 86.3 fL (ref 78.0–100.0)
MONOS PCT: 5 %
Monocytes Absolute: 0.5 10*3/uL (ref 0.1–1.0)
NEUTROS PCT: 15 %
Neutro Abs: 1.5 10*3/uL — ABNORMAL LOW (ref 1.7–7.7)
Platelets: 256 10*3/uL (ref 150–400)
RBC: 4.45 MIL/uL (ref 3.87–5.11)
RDW: 14 % (ref 11.5–15.5)
WBC: 10.2 10*3/uL (ref 4.0–10.5)

## 2017-10-08 LAB — COMPREHENSIVE METABOLIC PANEL
ALBUMIN: 4.1 g/dL (ref 3.5–5.0)
ALT: 14 U/L (ref 0–44)
AST: 18 U/L (ref 15–41)
Alkaline Phosphatase: 37 U/L — ABNORMAL LOW (ref 38–126)
Anion gap: 9 (ref 5–15)
BUN: 18 mg/dL (ref 8–23)
CO2: 24 mmol/L (ref 22–32)
Calcium: 9.2 mg/dL (ref 8.9–10.3)
Chloride: 108 mmol/L (ref 98–111)
Creatinine, Ser: 1.14 mg/dL — ABNORMAL HIGH (ref 0.44–1.00)
GFR calc Af Amer: 57 mL/min — ABNORMAL LOW (ref >60)
GFR, EST NON AFRICAN AMERICAN: 49 mL/min — AB (ref >60)
GLUCOSE: 87 mg/dL (ref 70–99)
POTASSIUM: 3.2 mmol/L — AB (ref 3.5–5.1)
Sodium: 141 mmol/L (ref 135–145)
Total Bilirubin: 0.6 mg/dL (ref 0.3–1.2)
Total Protein: 7.1 g/dL (ref 6.5–8.1)

## 2017-10-08 LAB — LACTATE DEHYDROGENASE: LDH: 162 U/L (ref 98–192)

## 2017-10-08 MED ORDER — IOHEXOL 300 MG/ML  SOLN
75.0000 mL | Freq: Once | INTRAMUSCULAR | Status: AC | PRN
  Administered 2017-10-08: 75 mL via INTRAVENOUS

## 2017-10-14 ENCOUNTER — Ambulatory Visit (HOSPITAL_COMMUNITY): Payer: BLUE CROSS/BLUE SHIELD | Admitting: Internal Medicine

## 2017-10-15 ENCOUNTER — Other Ambulatory Visit (HOSPITAL_COMMUNITY): Payer: BLUE CROSS/BLUE SHIELD

## 2017-10-16 ENCOUNTER — Encounter (HOSPITAL_COMMUNITY): Payer: Self-pay | Admitting: Internal Medicine

## 2017-10-16 ENCOUNTER — Other Ambulatory Visit (HOSPITAL_COMMUNITY): Payer: Self-pay | Admitting: Internal Medicine

## 2017-10-16 ENCOUNTER — Inpatient Hospital Stay (HOSPITAL_BASED_OUTPATIENT_CLINIC_OR_DEPARTMENT_OTHER): Payer: BLUE CROSS/BLUE SHIELD | Admitting: Internal Medicine

## 2017-10-16 VITALS — BP 141/65 | HR 71 | Temp 98.3°F | Resp 14 | Wt 171.3 lb

## 2017-10-16 DIAGNOSIS — E119 Type 2 diabetes mellitus without complications: Secondary | ICD-10-CM

## 2017-10-16 DIAGNOSIS — F329 Major depressive disorder, single episode, unspecified: Secondary | ICD-10-CM

## 2017-10-16 DIAGNOSIS — Z9221 Personal history of antineoplastic chemotherapy: Secondary | ICD-10-CM

## 2017-10-16 DIAGNOSIS — Z79899 Other long term (current) drug therapy: Secondary | ICD-10-CM

## 2017-10-16 DIAGNOSIS — R928 Other abnormal and inconclusive findings on diagnostic imaging of breast: Secondary | ICD-10-CM | POA: Diagnosis not present

## 2017-10-16 DIAGNOSIS — R918 Other nonspecific abnormal finding of lung field: Secondary | ICD-10-CM

## 2017-10-16 DIAGNOSIS — R110 Nausea: Secondary | ICD-10-CM

## 2017-10-16 DIAGNOSIS — C9110 Chronic lymphocytic leukemia of B-cell type not having achieved remission: Secondary | ICD-10-CM

## 2017-10-16 DIAGNOSIS — C9190 Lymphoid leukemia, unspecified not having achieved remission: Secondary | ICD-10-CM | POA: Diagnosis not present

## 2017-10-16 DIAGNOSIS — I10 Essential (primary) hypertension: Secondary | ICD-10-CM

## 2017-10-16 DIAGNOSIS — J432 Centrilobular emphysema: Secondary | ICD-10-CM

## 2017-10-16 DIAGNOSIS — R5383 Other fatigue: Secondary | ICD-10-CM

## 2017-10-16 DIAGNOSIS — I2510 Atherosclerotic heart disease of native coronary artery without angina pectoris: Secondary | ICD-10-CM

## 2017-10-16 DIAGNOSIS — R197 Diarrhea, unspecified: Secondary | ICD-10-CM

## 2017-10-16 DIAGNOSIS — E7800 Pure hypercholesterolemia, unspecified: Secondary | ICD-10-CM

## 2017-10-16 DIAGNOSIS — L723 Sebaceous cyst: Secondary | ICD-10-CM

## 2017-10-16 DIAGNOSIS — Z87891 Personal history of nicotine dependence: Secondary | ICD-10-CM

## 2017-10-16 DIAGNOSIS — Z1231 Encounter for screening mammogram for malignant neoplasm of breast: Secondary | ICD-10-CM

## 2017-10-16 DIAGNOSIS — M8580 Other specified disorders of bone density and structure, unspecified site: Secondary | ICD-10-CM

## 2017-10-16 NOTE — Progress Notes (Signed)
Diagnosis Abnormal findings on diagnostic imaging of lung - Plan: CBC with Differential/Platelet, Comprehensive metabolic panel, Lactate dehydrogenase, MM DIAG BREAST TOMO BILATERAL, [place] Unlisted Procedure Breast  CLL (chronic lymphocytic leukemia) (HCC) - Plan: CBC with Differential/Platelet, Comprehensive metabolic panel, Lactate dehydrogenase, MM DIAG BREAST TOMO BILATERAL, [place] Unlisted Procedure Breast, CBC with Differential/Platelet, Comprehensive metabolic panel, Lactate dehydrogenase  Staging Cancer Staging No matching staging information was found for the patient.  Assessment and Plan:   1.CLL, 13q deletion.  Pt was diagnosed in 11/2011.  Patients with 13q deletion as sole chromosomal abnormality found on FISH have longest survival time.  Monitored for treatment parameters, until 2016 when she was treated with Bendamustine/Rituxan x 4 cycles from 08/02/14-09/01/14 with normalization of blood counts and lymphadenopathy.  Chemo course was complicated by hospitalization for fever requiring Bendamustin dose reduction.  Was noted to be in remission in 12/2014.  Relapsed disease noted in 06/2015 with changes in WBCs/lymphocyte count and LDH.  Ibrutinib started in 06/2015. Pt has history of questionable compliance on Imbruvica in the past.     Labs done 10/08/2017 reviewed and showed WBC 10.2 HB 12.5 plts 256,000.  Chemistries WNL with Cr 1.14, normal LFTs, LDH normal at 162.  Counts have improved and are WNL on labs done today.  She will have repeat labs in 12/2017 for ongoing follow-up.  She will RTC 03/2018 with labs.  I have again discussed with her improvement in labs and need for compliance with therapy.   2.  Abnormal mammogram.  Pt had screening mammogram done 03/20/2017 that showed a question of lesion in the right axilla and left breast.  She was recommended for left breast USN that was done on 04/09/2017 that showed  IMPRESSION: Benign sebaceous cyst in the right axilla.  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  RECOMMENDATION: Left breast ultrasound is recommended in 6 months.  left breast USN done 10/08/2017 reviewed and showed  : No significant change in a probable fibroadenoma in the 2:30 o'clock position of the left breast.  Radiology is recommending follow-up in 6 months with bilateral diagnostic mammogram and left breast USN.  This is set up for 03/2018 and she will be seen for follow-up at that time.  She does not desire biopsy of area which was also discussed as an option.    3.  Pulmonary nodule.  This was reported on CT scan done at an outside facility Jul 07, 2014.  Nodule measured 5 mm.  She has a history of smoking but reports she has quit smoking.  CT of the chest done 10/08/2017 reviewed and showed IMPRESSION: 1. Stable benign-appearing 5 mm noncalcified nodule in the periphery of the left lower lobe. Follow-up CT of the chest only if the patient is considered high risk in 1 year. 2. Faint coronary artery calcifications. 3. Mild centrilobular emphysema.  She will be set up for CT chest without contrast in 09/2018 for ongoing follow-up.    4.  Hypertension.  Blood pressure is 141/65.  Follow-up with PCP.    5.  Nausea/diarrhea.   Pt should continue imodium and antiemetics as recommended.  Follow-up with GI as recommended.  She had CT abdomen and pelvis done 06/2017 that showed liver cysts but no other abdominal processes.    6.  Compliance with therapy.  Pt reports she is now taking medication as directed.  Labs improved today. If ongoing diarrhea may consider decrease in dose to 280 mg daily with close lab monitoring. She will have repeat  labs 12/2017.    7.  Fatigue.  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  HB is 12.5 on labs done 10/08/2017.    Greater than 30 minutes spent with more than 50% spent in counseling and coordination of care.    Interval History:  Historical data obtained from the note dated 06/14/2017.  CLL, 13q deletion.  Pt was  diagnosed in 11/2011.  Patients with 13q deletion as sole chromosomal abnormality found on FISH have longest survival time.  Monitored for treatment parameters, until 2016 when she was treated with Bendamustine/Rituxan x 4 cycles from 08/02/14-09/01/14 with normalization of blood counts and lymphadenopathy.  Chemo course was complicated by hospitalization for fever requiring Bendamustin dose reduction.  Was noted to be in remission in 12/2014.  Relapsed disease noted in 06/2015 with changes in WBCs/lymphocyte count and LDH.  Ibrutinib started in 06/2015. Pt has history of questionable compliance on Imbruvica in the past.   Current Status:  Pt is seen today for follow-up to go over labs.  She is here to go over labs and scans. She reports fatigue but reports this is due to working 3rd shift job.       CLL (chronic lymphocytic leukemia) ([place])   12/16/2011 Initial Diagnosis    CLL (chronic lymphocytic leukemia), stage A ([place]), stage 0 ([place]ai)    01/13/2013 Pathology Results    Monoclonal B cell population is detected with kapp light chain restriction, representing 82% of peripheral leukocytes. B cell population expresses CD19, CD20, CD11C, CD23, and aberrant CD5. No blasts.    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      03/30/2014 Cancer Staging    CLL Stage B ([place]), Stage I (Rai), now with doubling of WBC and approximate 6 months and widespread adenopathy, dropping hemoglobin, but not under 11 g/dL yet, fatigue and weakness    05/31/2014 Cancer Staging    Stage III by both Binet and Rai staging systems, HGB 10.6 g/dL.    07/07/2014 Imaging    CT CAP- extensive adenopathy w/in low neck, chest, abdomen, and pelvis.  Centrilobular emphysema. Nonspecific 5 mm LLL pulmonary nodule. Nonspecific focal left-sided pleural nodularity. 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    08/02/2014 - 11/02/2014 Chemotherapy     Bendamustine/Rituxan x 4 cycles with normalization of HGB and WBC and PLT count with disappearance of all symptoms and lymph nodes    09/07/2014 Adverse Reaction    Hospitalization with fever and chills, acute bronchitis, negative blood cultures. Comlicated by Levaquin skin rash.  Bendamustine dose reduced for next cycle.    09/08/2014 Echocardiogram    Left ventricular wall motion and contractility are within normal limits.  The estimated ejection fraction is greater than 65%.  Normal left ventricular diastolic filling is observed.    12/21/2014 Treatment Plan Change    Chemotherapy discontinued following 4 cycles of therapy due to tremendouns response and disappearance of all symptoms and lymph nodes.    12/21/2014 Remission       06/22/2015 Relapse/Recurrence    Per Dr. [name]: "Now relapsing changes in her WBC count, lymphocyte count, LDH level. Before she gets symptomatic again, I think is worthy of starting her on Ibrutinib while she has a great performance status..."    06/29/2015 -  Chemotherapy    Ibrutinib initiated      Problem List Patient Active Problem List   Diagnosis Date Noted  . HTN (  hypertension) [I10] 09/15/2015  . Hypokalemia [E87.6] 06/22/2015  . 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 Port-A-Cath in place [facility] 12/21/2014  . CLL (chronic lymphocytic leukemia) ([place]) [C91.90] 03/30/2014  . Essential hypertension [I10] 12/21/2011  . Hypercholesterolemia [E78.00] 12/21/2011  . Osteopenia [M85.80] 12/21/2011  . Tobacco abuse [Z72.0] 12/21/2011  . Alcohol dependence, continuous ([place]) [F10.20] 05/15/2004    Past Medical History Past Medical History:  Diagnosis Date  . Alcohol dependence, continuous ([place]) 05/15/2004   Overview:  ICD10 Conversion  . Anemia   . 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 CLL (chronic lymphocytic leukemia) ([place])   . Depression   . Diabetes mellitus without complication ([place])   . Hypertension   . Hypokalemia   .  Leukemia ([place])   . Tobacco abuse 12/21/2011   Quit in July 2016    Past Surgical History History reviewed. No pertinent surgical history.  Family History Family History  Problem Relation Age of Onset  . Heart failure Mother   . Diabetes Mother   . Hypertension Mother   . Heart failure Father      Social History  reports that she quit smoking about 3 years ago. Her smoking use included cigarettes. She has a 45.00 pack-year smoking history. She has quit using smokeless tobacco. She reports that she drinks about 2.0 standard drinks of alcohol per week. She reports that she does not use drugs.  Medications  Current Outpatient Medications:  .  ALPRAZolam (XANAX) 0.25 MG tablet, Take one tablet by mouth every 6 to 8 hours as needed for anxiety., Disp: , Rfl:  .  atorvastatin (LIPITOR) 20 MG tablet, , Disp: , Rfl:  .  fenofibrate (TRICOR) 145 MG tablet, Take 145 mg by mouth daily., Disp: , Rfl: 1 .  Ibrutinib (IMBRUVICA) 420 MG TABS, Take 420 mg by mouth daily., Disp: 28 tablet, Rfl: 0 .  lidocaine-prilocaine (EMLA) cream, Apply 1 application topically as needed., Disp: 30 g, Rfl: 1 .  lisinopril-hydrochlorothiazide (PRINZIDE,ZESTORETIC) 20-12.5 MG tablet, Take 1 tablet by mouth daily. , Disp: , Rfl:  .  Multiple Vitamins-Minerals (THERA-M) TABS, Take by mouth., Disp: , Rfl:  .  omeprazole (PRILOSEC) 20 MG capsule, Take 1 capsule (20 mg total) by mouth daily., Disp: 30 capsule, Rfl: 2 .  ondansetron (ZOFRAN) 8 MG tablet, Take 1 tablet (8 mg total) by mouth every 8 (eight) hours as needed for nausea or vomiting., Disp: 30 tablet, Rfl: 3 .  potassium chloride SA (K-DUR,KLOR-CON) 20 MEQ tablet, Take 2 tablets (40 mEq total) by mouth 2 (two) times daily., Disp: 30 tablet, Rfl: 0 .  prochlorperazine (COMPAZINE) 10 MG tablet, Take 1 tablet (10 mg total) by mouth every 6 (six) hours as needed for nausea or vomiting., Disp: 30 tablet, Rfl: 3  Allergies Aspirin and Levaquin  [levofloxacin in  d5w]  Review of Systems Review of Systems - Oncology ROS negative other than nausea.     Physical Exam  Vitals Wt Readings from Last 3 Encounters:  10/16/17 171 lb 4.8 oz (77.7 kg)  09/18/17 170 lb 11.2 oz (77.4 kg)  06/14/17 169 lb 12.8 oz (77 kg)   Temp Readings from Last 3 Encounters:  10/16/17 98.3 F (36.8 C) (Oral)  09/18/17 97.8 F (36.6 C) (Oral)  06/14/17 98.5 F (36.9 C) (Oral)   BP Readings from Last 3 Encounters:  10/16/17 (!) 141/65  09/18/17 132/60  06/14/17 (!) 140/56   Pulse Readings from Last 3 Encounters:  10/16/17 71  09/18/17 68  06/14/17 68   Constitutional: Well-developed, well-nourished, and in no distress.   HENT: Head: Normocephalic and atraumatic.  Mouth/Throat: No oropharyngeal exudate. Mucosa moist. Eyes: Pupils are equal, round, and reactive to light. Conjunctivae are normal. No scleral icterus.  Neck: Normal range of motion. Neck supple. No JVD present.  Cardiovascular: Normal rate, regular rhythm and normal heart sounds.  Exam reveals no gallop and no friction rub.   No murmur heard. Pulmonary/Chest: Effort normal and breath sounds normal. No respiratory distress. No wheezes.No rales.  Abdominal: Soft. Bowel sounds are normal. No distension. There is no tenderness. There is no guarding.  Musculoskeletal: No edema or tenderness.  Lymphadenopathy: No cervical, axillary or supraclavicular adenopathy.  Neurological: Alert and oriented to person, place, and time. No cranial nerve deficit.  Skin: Skin is warm and dry. No rash noted. No erythema. No pallor.  Psychiatric: Affect and judgment normal.   Labs No visits with results within 3 Day(s) from this visit.  Latest known visit with results is:  Appointment on 10/08/2017  Component Date Value Ref Range Status  . WBC 10/08/2017 10.2  4.0 - 10.5 K/uL Final  . RBC 10/08/2017 4.45  3.87 - 5.11 MIL/uL Final  . Hemoglobin 10/08/2017 12.5  12.0 - 15.0 g/dL Final  . HCT 10/08/2017 38.4  36.0 -  46.0 % Final  . MCV 10/08/2017 86.3  78.0 - 100.0 fL Final  . MCH 10/08/2017 28.1  26.0 - 34.0 pg Final  . MCHC 10/08/2017 32.6  30.0 - 36.0 g/dL Final  . RDW 10/08/2017 14.0  11.5 - 15.5 % Final  . Platelets 10/08/2017 256  150 - 400 K/uL Final  . Neutrophils Relative % 10/08/2017 15  % Final  . Lymphocytes Relative 10/08/2017 74  % Final  . Monocytes Relative 10/08/2017 5  % Final  . Eosinophils Relative 10/08/2017 5  % Final  . Basophils Relative 10/08/2017 1  % Final  . Neutro Abs 10/08/2017 1.5* 1.7 - 7.7 K/uL Final  . Lymphs Abs 10/08/2017 7.6* 0.7 - 4.0 K/uL Final  . Monocytes Absolute 10/08/2017 0.5  0.1 - 1.0 K/uL Final  . Eosinophils Absolute 10/08/2017 0.5  0.0 - 0.7 K/uL Final  . Basophils Absolute 10/08/2017 0.1  0.0 - 0.1 K/uL Final  . WBC Morphology 10/08/2017 ABSOLUTE LYMPHOCYTOSIS   Final   Comment: ATYPICAL LYMPHOCYTES SMUDGE CELLS Performed at [facility], [address], [place], [place] [address]   . Sodium 10/08/2017 141  135 - 145 mmol/L Final  . Potassium 10/08/2017 3.2* 3.5 - 5.1 mmol/L Final  . Chloride 10/08/2017 108  98 - 111 mmol/L Final  . CO2 10/08/2017 24  22 - 32 mmol/L Final  . Glucose, Bld 10/08/2017 87  70 - 99 mg/dL Final  . BUN 10/08/2017 18  8 - 23 mg/dL Final  . Creatinine, Ser 10/08/2017 1.14* 0.44 - 1.00 mg/dL Final  . Calcium 10/08/2017 9.2  8.9 - 10.3 mg/dL Final  . Total Protein 10/08/2017 7.1  6.5 - 8.1 g/dL Final  . Albumin 10/08/2017 4.1  3.5 - 5.0 g/dL Final  . AST 10/08/2017 18  15 - 41 U/L Final  . ALT 10/08/2017 14  0 - 44 U/L Final  . Alkaline Phosphatase 10/08/2017 37* 38 - 126 U/L Final  . Total Bilirubin 10/08/2017 0.6  0.3 - 1.2 mg/dL Final  . GFR calc non Af Amer 10/08/2017 49* >60 mL/min Final  . GFR calc Af Amer 10/08/2017 57* >60 mL/min Final   Comment: (NOTE)  The eGFR has been calculated using the CKD EPI equation. This calculation has not been validated in all clinical situations. eGFR's persistently <60 mL/min  signify possible Chronic Kidney Disease.   Georgiann Hahn gap 10/08/2017 9  5 - 15 Final   Performed at [facility], [address], [place], [place] [address]  . LDH 10/08/2017 162  98 - 192 U/L Final   Performed at [facility], [address] [place], [place] [address]     Pathology Orders Placed This Encounter  Procedures  . MM DIAG BREAST TOMO BILATERAL    Standing Status:   Future    Standing Expiration Date:   10/17/2018    Order Specific Question:   Reason for Exam (SYMPTOM  OR DIAGNOSIS REQUIRED)    Answer:   left breast mass    Order Specific Question:   Preferred imaging location?    Answer:   [facility]  . [place] Unlisted Procedure Breast    Standing Status:   Future    Standing Expiration Date:   12/17/2018    Order Specific Question:   Reason for Exam (SYMPTOM  OR DIAGNOSIS REQUIRED)    Answer:   left breast mass.  Left breast Ultrasound    Order Specific Question:   Preferred imaging location?    Answer:   [facility]  . CBC with Differential/Platelet    Standing Status:   Future    Standing Expiration Date:   10/17/2019  . Comprehensive metabolic panel    Standing Status:   Future    Standing Expiration Date:   10/17/2019  . Lactate dehydrogenase    Standing Status:   Future    Standing Expiration Date:   10/17/2019  . CBC with Differential/Platelet    Standing Status:   Future    Standing Expiration Date:   10/17/2018  . Comprehensive metabolic panel    Standing Status:   Future    Standing Expiration Date:   10/17/2018  . Lactate dehydrogenase    Standing Status:   Future    Standing Expiration Date:   10/17/2018       Zoila Shutter MD

## 2017-10-16 NOTE — Progress Notes (Signed)
Patient states she has not missed any doses of Imbruvica. She denies any side effects.

## 2017-10-18 ENCOUNTER — Other Ambulatory Visit (HOSPITAL_COMMUNITY): Payer: Self-pay | Admitting: *Deleted

## 2017-10-18 DIAGNOSIS — C9110 Chronic lymphocytic leukemia of B-cell type not having achieved remission: Secondary | ICD-10-CM

## 2017-10-18 MED ORDER — IBRUTINIB 420 MG PO TABS: 420.0000 mg | ORAL_TABLET | Freq: Every day | ORAL | 0 refills | Status: DC

## 2017-10-18 NOTE — Telephone Encounter (Signed)
Chart reviewed, per Dr. Walden Field last note, patient to continue Imbruvica, med refilled.

## 2017-11-18 ENCOUNTER — Other Ambulatory Visit (HOSPITAL_COMMUNITY): Payer: Self-pay | Admitting: *Deleted

## 2017-11-18 DIAGNOSIS — C9110 Chronic lymphocytic leukemia of B-cell type not having achieved remission: Secondary | ICD-10-CM

## 2017-11-18 MED ORDER — IBRUTINIB 420 MG PO TABS: 420.0000 mg | ORAL_TABLET | Freq: Every day | ORAL | 0 refills | Status: DC

## 2017-11-18 NOTE — Telephone Encounter (Signed)
Chart reviewed, Imbruvica refilled.

## 2017-11-19 ENCOUNTER — Other Ambulatory Visit (HOSPITAL_COMMUNITY): Payer: Self-pay | Admitting: *Deleted

## 2017-11-19 DIAGNOSIS — C9110 Chronic lymphocytic leukemia of B-cell type not having achieved remission: Secondary | ICD-10-CM

## 2017-11-19 MED ORDER — IBRUTINIB 420 MG PO TABS: 420.0000 mg | ORAL_TABLET | Freq: Every day | ORAL | 0 refills | Status: DC

## 2017-12-18 ENCOUNTER — Inpatient Hospital Stay (HOSPITAL_COMMUNITY): Payer: BLUE CROSS/BLUE SHIELD | Attending: Hematology

## 2017-12-18 ENCOUNTER — Encounter (HOSPITAL_COMMUNITY): Payer: Self-pay

## 2017-12-18 DIAGNOSIS — C9190 Lymphoid leukemia, unspecified not having achieved remission: Secondary | ICD-10-CM | POA: Diagnosis not present

## 2017-12-18 DIAGNOSIS — C9110 Chronic lymphocytic leukemia of B-cell type not having achieved remission: Secondary | ICD-10-CM

## 2017-12-18 LAB — CBC WITH DIFFERENTIAL/PLATELET
Abs Immature Granulocytes: 0.02 10*3/uL (ref 0.00–0.07)
BASOS ABS: 0.1 10*3/uL (ref 0.0–0.1)
Basophils Relative: 1 %
EOS PCT: 3 %
Eosinophils Absolute: 0.2 10*3/uL (ref 0.0–0.5)
HCT: 36.9 % (ref 36.0–46.0)
HEMOGLOBIN: 11.8 g/dL — AB (ref 12.0–15.0)
IMMATURE GRANULOCYTES: 0 %
LYMPHS PCT: 59 %
Lymphs Abs: 4.2 10*3/uL — ABNORMAL HIGH (ref 0.7–4.0)
MCH: 28.2 pg (ref 26.0–34.0)
MCHC: 32 g/dL (ref 30.0–36.0)
MCV: 88.1 fL (ref 80.0–100.0)
Monocytes Absolute: 0.6 10*3/uL (ref 0.1–1.0)
Monocytes Relative: 8 %
NEUTROS PCT: 29 %
NRBC: 0 % (ref 0.0–0.2)
Neutro Abs: 2.1 10*3/uL (ref 1.7–7.7)
Platelets: 273 10*3/uL (ref 150–400)
RBC: 4.19 MIL/uL (ref 3.87–5.11)
RDW: 13.9 % (ref 11.5–15.5)
WBC: 7.1 10*3/uL (ref 4.0–10.5)

## 2017-12-18 LAB — COMPREHENSIVE METABOLIC PANEL
ALT: 19 U/L (ref 0–44)
ANION GAP: 11 (ref 5–15)
AST: 19 U/L (ref 15–41)
Albumin: 3.9 g/dL (ref 3.5–5.0)
Alkaline Phosphatase: 37 U/L — ABNORMAL LOW (ref 38–126)
BUN: 22 mg/dL (ref 8–23)
CHLORIDE: 108 mmol/L (ref 98–111)
CO2: 21 mmol/L — AB (ref 22–32)
CREATININE: 1.03 mg/dL — AB (ref 0.44–1.00)
Calcium: 9.1 mg/dL (ref 8.9–10.3)
GFR, EST NON AFRICAN AMERICAN: 55 mL/min — AB (ref >60)
Glucose, Bld: 94 mg/dL (ref 70–99)
POTASSIUM: 3.2 mmol/L — AB (ref 3.5–5.1)
SODIUM: 140 mmol/L (ref 135–145)
Total Bilirubin: 0.8 mg/dL (ref 0.3–1.2)
Total Protein: 6.7 g/dL (ref 6.5–8.1)

## 2017-12-18 LAB — LACTATE DEHYDROGENASE: LDH: 156 U/L (ref 98–192)

## 2017-12-18 MED ORDER — SODIUM CHLORIDE 0.9% FLUSH
20.0000 mL | INTRAVENOUS | Status: DC | PRN
  Administered 2017-12-18: 20 mL via INTRAVENOUS
  Filled 2017-12-18: qty 20

## 2017-12-18 MED ORDER — HEPARIN SOD (PORK) LOCK FLUSH 100 UNIT/ML IV SOLN
500.0000 [IU] | Freq: Once | INTRAVENOUS | Status: AC
  Administered 2017-12-18: 500 [IU] via INTRAVENOUS

## 2017-12-18 NOTE — Progress Notes (Signed)
Brittany Rowland tolerated port lab draw well without complaints or incident. Port accessed with 20 gauge needle with blood drawn for labs ordered then flushed with 20 ml NS and 5 ml Heparin easily per protocol then de-accessed.VSS Pt discharged self ambulatory in satisfactory condition

## 2017-12-18 NOTE — Patient Instructions (Signed)
[place] at [facility] Discharge Instructions  Labs drawn form portacath and flushed per protocol. Follow-up as scheduled. Call clinic for any questions or concerns   Thank you for choosing [facility] at [facility] to provide your oncology and hematology care.  To afford each patient quality time with our provider, please arrive at least 15 minutes before your scheduled appointment time.   If you have a lab appointment with the [facility] please come in thru the  Main Entrance and check in at the main information desk  You need to re-schedule your appointment should you arrive 10 or more minutes late.  We strive to give you quality time with our providers, and arriving late affects you and other patients whose appointments are after yours.  Also, if you no show three or more times for appointments you may be dismissed from the clinic at the providers discretion.     Again, thank you for choosing [facility].  Our hope is that these requests will decrease the amount of time that you wait before being seen by our physicians.       _____________________________________________________________  Should you have questions after your visit to [facility], please contact our office at [phone] between the hours of 8:00 a.m. and 4:30 p.m.  Voicemails left after 4:00 p.m. will not be returned until the following business day.  For prescription refill requests, have your pharmacy contact our office and allow 72 hours.    Cancer Center Support Programs:   > Cancer Support Group  2nd Tuesday of the month 1pm-2pm, Journey Room

## 2017-12-19 ENCOUNTER — Encounter (HOSPITAL_COMMUNITY): Payer: BLUE CROSS/BLUE SHIELD

## 2017-12-26 ENCOUNTER — Telehealth (HOSPITAL_COMMUNITY): Payer: Self-pay

## 2017-12-26 DIAGNOSIS — C9110 Chronic lymphocytic leukemia of B-cell type not having achieved remission: Secondary | ICD-10-CM

## 2017-12-26 MED ORDER — IBRUTINIB 420 MG PO TABS: 420.0000 mg | ORAL_TABLET | Freq: Every day | ORAL | 0 refills | Status: DC

## 2017-12-26 NOTE — Telephone Encounter (Signed)
[facility] pharmacy calling for refill on Imbruvica.  Chart checked and filled.

## 2017-12-27 ENCOUNTER — Ambulatory Visit: Payer: BLUE CROSS/BLUE SHIELD | Admitting: Gastroenterology

## 2018-03-10 ENCOUNTER — Other Ambulatory Visit (HOSPITAL_COMMUNITY): Payer: Self-pay | Admitting: Physician Assistant

## 2018-03-10 DIAGNOSIS — R928 Other abnormal and inconclusive findings on diagnostic imaging of breast: Secondary | ICD-10-CM

## 2018-03-19 ENCOUNTER — Telehealth (HOSPITAL_COMMUNITY): Payer: Self-pay | Admitting: Internal Medicine

## 2018-03-19 ENCOUNTER — Other Ambulatory Visit (HOSPITAL_COMMUNITY): Payer: Self-pay | Admitting: *Deleted

## 2018-03-19 ENCOUNTER — Inpatient Hospital Stay (HOSPITAL_COMMUNITY): Payer: BLUE CROSS/BLUE SHIELD | Attending: Hematology

## 2018-03-19 ENCOUNTER — Telehealth (HOSPITAL_COMMUNITY): Payer: Self-pay | Admitting: Pharmacy Technician

## 2018-03-19 ENCOUNTER — Other Ambulatory Visit: Payer: Self-pay

## 2018-03-19 VITALS — BP 159/89 | HR 73 | Temp 97.6°F | Resp 16

## 2018-03-19 DIAGNOSIS — Z95828 Presence of other vascular implants and grafts: Secondary | ICD-10-CM

## 2018-03-19 DIAGNOSIS — R918 Other nonspecific abnormal finding of lung field: Secondary | ICD-10-CM

## 2018-03-19 DIAGNOSIS — C9110 Chronic lymphocytic leukemia of B-cell type not having achieved remission: Secondary | ICD-10-CM

## 2018-03-19 LAB — COMPREHENSIVE METABOLIC PANEL WITH GFR
ALT: 17 U/L (ref 0–44)
AST: 19 U/L (ref 15–41)
Albumin: 4.3 g/dL (ref 3.5–5.0)
Alkaline Phosphatase: 40 U/L (ref 38–126)
Anion gap: 10 (ref 5–15)
BUN: 26 mg/dL — ABNORMAL HIGH (ref 8–23)
CO2: 23 mmol/L (ref 22–32)
Calcium: 9.4 mg/dL (ref 8.9–10.3)
Chloride: 103 mmol/L (ref 98–111)
Creatinine, Ser: 1.01 mg/dL — ABNORMAL HIGH (ref 0.44–1.00)
GFR calc Af Amer: >60 mL/min
GFR calc non Af Amer: 58 mL/min — ABNORMAL LOW
Glucose, Bld: 117 mg/dL — ABNORMAL HIGH (ref 70–99)
Potassium: 3.2 mmol/L — ABNORMAL LOW (ref 3.5–5.1)
Sodium: 136 mmol/L (ref 135–145)
Total Bilirubin: 0.3 mg/dL (ref 0.3–1.2)
Total Protein: 7.3 g/dL (ref 6.5–8.1)

## 2018-03-19 LAB — CBC WITH DIFFERENTIAL/PLATELET
Abs Immature Granulocytes: 0.01 10*3/uL (ref 0.00–0.07)
BASOS ABS: 0.1 10*3/uL (ref 0.0–0.1)
Basophils Relative: 1 %
EOS PCT: 3 %
Eosinophils Absolute: 0.2 10*3/uL (ref 0.0–0.5)
HEMATOCRIT: 39.2 % (ref 36.0–46.0)
HEMOGLOBIN: 12.3 g/dL (ref 12.0–15.0)
IMMATURE GRANULOCYTES: 0 %
LYMPHS PCT: 60 %
Lymphs Abs: 3.5 10*3/uL (ref 0.7–4.0)
MCH: 27 pg (ref 26.0–34.0)
MCHC: 31.4 g/dL (ref 30.0–36.0)
MCV: 86.2 fL (ref 80.0–100.0)
Monocytes Absolute: 0.5 10*3/uL (ref 0.1–1.0)
Monocytes Relative: 8 %
NEUTROS ABS: 1.6 10*3/uL — AB (ref 1.7–7.7)
NEUTROS PCT: 28 %
NRBC: 0 % (ref 0.0–0.2)
Platelets: 261 10*3/uL (ref 150–400)
RBC: 4.55 MIL/uL (ref 3.87–5.11)
RDW: 13.9 % (ref 11.5–15.5)
WBC: 5.9 10*3/uL (ref 4.0–10.5)

## 2018-03-19 LAB — LACTATE DEHYDROGENASE: LDH: 154 U/L (ref 98–192)

## 2018-03-19 MED ORDER — HEPARIN SOD (PORK) LOCK FLUSH 100 UNIT/ML IV SOLN
500.0000 [IU] | Freq: Once | INTRAVENOUS | Status: AC
  Administered 2018-03-19: 500 [IU] via INTRAVENOUS

## 2018-03-19 MED ORDER — SODIUM CHLORIDE 0.9% FLUSH
10.0000 mL | INTRAVENOUS | Status: AC | PRN
  Administered 2018-03-19: 10 mL via INTRAVENOUS
  Filled 2018-03-19: qty 10

## 2018-03-19 MED ORDER — IBRUTINIB 420 MG PO TABS: 420.0000 mg | ORAL_TABLET | Freq: Every day | ORAL | 3 refills | Status: DC

## 2018-03-19 NOTE — Telephone Encounter (Signed)
Oral Oncology Patient Advocate Encounter  Prior Authorization for Brittany Rowland has been approved.    PA# [id number]  Effective dates: 03/19/2018 through 09/17/2018  Oral Oncology Clinic will continue to follow.

## 2018-03-19 NOTE — Telephone Encounter (Signed)
Pt states [facility] can no longer fill her Imburvica script. Advised her that we will send her script to [facility] to fill.

## 2018-03-19 NOTE — Telephone Encounter (Signed)
Oral Oncology Patient Advocate Encounter  Received notification from [employer] that prior authorization for Imbruvica is required.  PA submitted on CoverMyMeds Key [id number]  Status is pending  Oral Oncology Clinic will continue to follow.  [place] Patient [place] Phone [phone] Fax [phone] 03/19/2018 4:24 PM

## 2018-03-20 ENCOUNTER — Telehealth (HOSPITAL_COMMUNITY): Payer: Self-pay | Admitting: Pharmacist

## 2018-03-20 DIAGNOSIS — C9110 Chronic lymphocytic leukemia of B-cell type not having achieved remission: Secondary | ICD-10-CM

## 2018-03-20 MED ORDER — IBRUTINIB 420 MG PO TABS: 420.0000 mg | ORAL_TABLET | Freq: Every day | ORAL | 3 refills | Status: DC

## 2018-03-20 NOTE — Telephone Encounter (Addendum)
Oral Oncology Patient Advocate Encounter  Must use [place] location. Phone number [phone].  2/5- Alyson called [facility]. It will take a couple days to process the prescription.  Patient can call on 2/6 to check on prescription.   I called the patient to let her know [facility] would be filling the Imbruvica.  I gave her the phone number and told her to call tomorrow afternoon to check the status of her prescription.  I told her to call if she had any other questions.  [place] Patient [place] Phone [phone] Fax [phone] 03/26/2018 4:27 PM

## 2018-03-20 NOTE — Telephone Encounter (Signed)
Oral Chemotherapy Pharmacist Encounter   Due to insurance restrictions patient must use Health and safety inspector or Sales promotion account executive. Prescription was sent to [facility].   Darl Pikes, PharmD, BCPS, [facility] Hematology/Oncology Clinical Pharmacist [facility]/AP Oral [facility] [phone]  03/20/2018 3:49 PM

## 2018-03-20 NOTE — Telephone Encounter (Signed)
Oral Oncology Pharmacist Encounter  Received refill prescription for Imbruivca (ibrutinib) for the treatment of CLL, planned duration until disease progression or unacceptable drug toxicity.  CBC from 03/19/2018 assessed, no relevant lab abnormalities. Prescription dose and frequency assessed.   Current medication list in Epic reviewed, no DDIs with ibrutinib identified.  Oral Oncology Clinic will continue to follow set-up at her new pharmacy.   Darl Pikes, PharmD, BCPS, [facility] Hematology/Oncology Clinical Pharmacist [facility]/AP Oral [facility] [phone]  03/20/2018 3:35 PM

## 2018-03-25 ENCOUNTER — Ambulatory Visit (HOSPITAL_COMMUNITY): Payer: BLUE CROSS/BLUE SHIELD

## 2018-03-25 ENCOUNTER — Ambulatory Visit (HOSPITAL_COMMUNITY)
Admission: RE | Admit: 2018-03-25 | Discharge: 2018-03-25 | Disposition: A | Discharge status: Home / Self Care | Payer: BLUE CROSS/BLUE SHIELD | Source: Ambulatory Visit | Attending: Internal Medicine | Admitting: Internal Medicine

## 2018-03-25 ENCOUNTER — Other Ambulatory Visit (HOSPITAL_COMMUNITY): Payer: BLUE CROSS/BLUE SHIELD

## 2018-03-25 ENCOUNTER — Ambulatory Visit (HOSPITAL_COMMUNITY)
Admission: RE | Admit: 2018-03-25 | Discharge: 2018-03-25 | Disposition: A | Discharge status: Home / Self Care | Payer: BLUE CROSS/BLUE SHIELD | Source: Ambulatory Visit | Attending: Physician Assistant | Admitting: Physician Assistant

## 2018-03-25 DIAGNOSIS — R928 Other abnormal and inconclusive findings on diagnostic imaging of breast: Secondary | ICD-10-CM | POA: Insufficient documentation

## 2018-03-25 DIAGNOSIS — C9110 Chronic lymphocytic leukemia of B-cell type not having achieved remission: Secondary | ICD-10-CM | POA: Diagnosis present

## 2018-03-25 DIAGNOSIS — R918 Other nonspecific abnormal finding of lung field: Secondary | ICD-10-CM | POA: Diagnosis not present

## 2018-03-26 ENCOUNTER — Encounter: Payer: Self-pay | Admitting: Gastroenterology

## 2018-03-26 ENCOUNTER — Other Ambulatory Visit: Payer: Self-pay

## 2018-03-26 ENCOUNTER — Ambulatory Visit (HOSPITAL_COMMUNITY): Payer: BLUE CROSS/BLUE SHIELD | Admitting: Hematology

## 2018-03-26 ENCOUNTER — Encounter (HOSPITAL_COMMUNITY): Payer: Self-pay | Admitting: Internal Medicine

## 2018-03-26 ENCOUNTER — Ambulatory Visit (INDEPENDENT_AMBULATORY_CARE_PROVIDER_SITE_OTHER): Payer: BLUE CROSS/BLUE SHIELD | Admitting: Gastroenterology

## 2018-03-26 ENCOUNTER — Inpatient Hospital Stay (HOSPITAL_COMMUNITY): Payer: BLUE CROSS/BLUE SHIELD | Attending: Hematology | Admitting: Internal Medicine

## 2018-03-26 VITALS — BP 160/71 | HR 61 | Temp 98.1°F | Resp 16 | Wt 173.0 lb

## 2018-03-26 DIAGNOSIS — M8580 Other specified disorders of bone density and structure, unspecified site: Secondary | ICD-10-CM | POA: Diagnosis not present

## 2018-03-26 DIAGNOSIS — R110 Nausea: Secondary | ICD-10-CM

## 2018-03-26 DIAGNOSIS — R918 Other nonspecific abnormal finding of lung field: Secondary | ICD-10-CM

## 2018-03-26 DIAGNOSIS — N6320 Unspecified lump in the left breast, unspecified quadrant: Secondary | ICD-10-CM

## 2018-03-26 DIAGNOSIS — E7800 Pure hypercholesterolemia, unspecified: Secondary | ICD-10-CM | POA: Diagnosis not present

## 2018-03-26 DIAGNOSIS — L723 Sebaceous cyst: Secondary | ICD-10-CM | POA: Diagnosis not present

## 2018-03-26 DIAGNOSIS — J439 Emphysema, unspecified: Secondary | ICD-10-CM | POA: Diagnosis not present

## 2018-03-26 DIAGNOSIS — F329 Major depressive disorder, single episode, unspecified: Secondary | ICD-10-CM | POA: Diagnosis not present

## 2018-03-26 DIAGNOSIS — Z87891 Personal history of nicotine dependence: Secondary | ICD-10-CM

## 2018-03-26 DIAGNOSIS — F1020 Alcohol dependence, uncomplicated: Secondary | ICD-10-CM

## 2018-03-26 DIAGNOSIS — R197 Diarrhea, unspecified: Secondary | ICD-10-CM | POA: Diagnosis not present

## 2018-03-26 DIAGNOSIS — I10 Essential (primary) hypertension: Secondary | ICD-10-CM

## 2018-03-26 DIAGNOSIS — R140 Abdominal distension (gaseous): Secondary | ICD-10-CM | POA: Diagnosis not present

## 2018-03-26 DIAGNOSIS — Z9221 Personal history of antineoplastic chemotherapy: Secondary | ICD-10-CM | POA: Diagnosis not present

## 2018-03-26 DIAGNOSIS — Z79899 Other long term (current) drug therapy: Secondary | ICD-10-CM | POA: Diagnosis not present

## 2018-03-26 DIAGNOSIS — C9110 Chronic lymphocytic leukemia of B-cell type not having achieved remission: Secondary | ICD-10-CM | POA: Diagnosis not present

## 2018-03-26 MED ORDER — IBRUTINIB 420 MG PO TABS: 420.0000 mg | ORAL_TABLET | Freq: Every day | ORAL | 0 refills | Status: DC

## 2018-03-26 NOTE — Patient Instructions (Signed)
Start a probiotic for the next 4 weeks. You can buy over the counter. Try Align or Restora or [employer] colon health.   I will review records from [facility] regarding colonoscopy and let you know next step.

## 2018-03-26 NOTE — Progress Notes (Signed)
Primary Care Physician:  Leory Plowman  Primary Gastroenterologist:  Barney Drain, MD   Chief Complaint  Patient presents with  . Nausea    1st thing in the mornings  . Diarrhea    getting better now but can eat spicey foods and will cause her to go the bathroom    HPI:  Brittany Rowland is a 68 y.o. female with a history of CLL who presents at the request of Dr. Ishmael Holter for further evaluation of intermittent diarrhea and nausea.  Unclear when her last colonoscopy was.  Was performed in [facility].  We have requested records.  Original referral came across several months ago, July 2019.  Patient has been provided antiemetics along the way, currently not using.  She was given Compazine and Zofran.  At this time denies any nausea.  Her main complaint is congestion and mucus in her throat when she wakes up first thing.  Once she clears her throat and nasal passages and discussed with the patient is fine the rest of the day.  She really denies nausea at this point.  No heartburn.  Interestingly the days that she does not work she has none of the symptoms.  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   She is to have more diarrhea than she is now.  Now is just every now and again.  Associated with hot sauce, spicy foods.  She has a bowel movement every day she works, otherwise may skip a day without a BM.  Denies heartburn, vomiting, dysphagia, melena, rectal bleeding.  She has some mild bloating.  Off imbruvica for two weeks.  Working on obtaining new prescription.  Current Outpatient Medications  Medication Sig Dispense Refill  . atorvastatin (LIPITOR) 20 MG tablet     . fenofibrate (TRICOR) 145 MG tablet Take 145 mg by mouth daily.  1  . IMBRUVICA 420 MG TABS Take 1 tablet by mouth daily.    Marland Kitchen lidocaine-prilocaine (EMLA) cream Apply 1 application topically as needed. 30 g 1  .  lisinopril-hydrochlorothiazide (PRINZIDE,ZESTORETIC) 20-12.5 MG tablet Take 1 tablet by mouth daily.     . Multiple Vitamins-Minerals (THERA-M) TABS Take by mouth.    . potassium chloride SA (K-DUR,KLOR-CON) 20 MEQ tablet Take 2 tablets (40 mEq total) by mouth 2 (two) times daily. 30 tablet 0   No current facility-administered medications for this visit.    Facility-Administered Medications Ordered in Other Visits  Medication Dose Route Frequency Provider Last Rate Last Dose  . sodium chloride flush (NS) 0.9 % injection 10 mL  10 mL Intravenous PRN Derek Jack, MD   10 mL at 03/19/18 1137    Allergies as of 03/26/2018 - Review Complete 03/26/2018  Allergen Reaction Noted  . Aspirin Nausea And Vomiting 03/30/2014  . Levaquin  [levofloxacin in d5w] Rash 09/14/2014    Past Medical History:  Diagnosis Date  . Alcohol dependence, continuous ([place]) 05/15/2004   Overview:  ICD10 Conversion  . Anemia   . 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 CLL (chronic lymphocytic leukemia) ([place])   . Depression   . Diabetes mellitus without complication ([place])   . Hypertension   . Hypokalemia   . Leukemia ([place])   . Tobacco abuse 12/21/2011   Quit in July 2016    Past Surgical History:  Procedure Laterality Date  . ABDOMINAL HYSTERECTOMY    . PORT A CATH INJECTION (  [place] [facility])      Family History  Problem Relation Age of Onset  . Heart failure Mother   . Diabetes Mother   . Hypertension Mother   . Heart failure Father   . Colon cancer Neg Hx     Social History   Socioeconomic History  . Marital status: Married    Spouse name: Not on file  . Number of children: Not on file  . Years of education: Not on file  . Highest education level: Not on file  Occupational History  . Not on file  Social Needs  . Financial resource strain: Not on file  . Food insecurity:    Worry: Not on file    Inability: Not on file  . Transportation needs:    Medical: Not on file     Non-medical: Not on file  Tobacco Use  . Smoking status: Former Smoker    Packs/day: 1.00    Years: 45.00    Pack years: 45.00    Types: Cigarettes    Last attempt to quit: 09/15/2014    Years since quitting: 3.5  . Smokeless tobacco: Former Network engineer and Sexual Activity  . Alcohol use: Yes    Alcohol/week: 2.0 standard drinks    Types: 2 Shots of liquor per week  . Drug use: No  . Sexual activity: Not on file  Lifestyle  . Physical activity:    Days per week: Not on file    Minutes per session: Not on file  . Stress: Not on file  Relationships  . Social connections:    Talks on phone: Not on file    Gets together: Not on file    Attends religious service: Not on file    Active member of club or organization: Not on file    Attends meetings of clubs or organizations: Not on file    Relationship status: Not on file  . Intimate partner violence:    Fear of current or ex partner: Not on file    Emotionally abused: Not on file    Physically abused: Not on file    Forced sexual activity: Not on file  Other Topics Concern  . Not on file  Social History Narrative  . Not on file      ROS:  General: Negative for anorexia, weight loss, fever, chills, fatigue, weakness. Eyes: Negative for vision changes.  ENT: Negative for hoarseness, difficulty swallowing , positive nasal congestion.  See HPI CV: Negative for chest pain, angina, palpitations, dyspnea on exertion, peripheral edema.  Respiratory: Negative for dyspnea at rest, dyspnea on exertion, cough, sputum, wheezing.  GI: See history of present illness. GU:  Negative for dysuria, hematuria, urinary incontinence, urinary frequency, nocturnal urination.  MS: Negative for joint pain, low back pain.  Derm: Negative for rash or itching.  Neuro: Negative for weakness, abnormal sensation, seizure, frequent headaches, memory loss, confusion.  Psych: Negative for anxiety, depression, suicidal ideation, hallucinations.  Endo:  Negative for unusual weight change.  Heme: Negative for bruising or bleeding. Allergy: Negative for rash or hives.    Physical Examination:  BP 127/75   Pulse 84   Temp 97.6 F (36.4 C) (Oral)   Ht 5\' 4"  (1.626 m)   Wt 174 lb 9.6 oz (79.2 kg)   BMI 29.97 kg/m    General: Well-nourished, well-developed in no acute distress.  Head: Normocephalic, atraumatic.   Eyes: Conjunctiva pink, no icterus. Mouth: Oropharyngeal mucosa moist and pink , no lesions  erythema or exudate. Neck: Supple without thyromegaly, masses, or lymphadenopathy.  Lungs: Clear to auscultation bilaterally.  Heart: Regular rate and rhythm, no murmurs rubs or gallops.  Abdomen: Bowel sounds are normal, nontender, nondistended, no hepatosplenomegaly or masses, no abdominal bruits or    hernia , no rebound or guarding.   Rectal: Not performed Extremities: No lower extremity edema. No clubbing or deformities.  Neuro: Alert and oriented x 4 , grossly normal neurologically.  Skin: Warm and dry, no rash or jaundice.   Psych: Alert and cooperative, normal mood and affect.  Labs: Lab Results  Component Value Date   CREATININE 1.01 (H) 03/19/2018   BUN 26 (H) 03/19/2018   NA 136 03/19/2018   K 3.2 (L) 03/19/2018   CL 103 03/19/2018   CO2 23 03/19/2018   Lab Results  Component Value Date   WBC 5.9 03/19/2018   HGB 12.3 03/19/2018   HCT 39.2 03/19/2018   MCV 86.2 03/19/2018   PLT 261 03/19/2018   Lab Results  Component Value Date   ALT 17 03/19/2018   AST 19 03/19/2018   ALKPHOS 40 03/19/2018   BILITOT 0.3 03/19/2018     Imaging Studies: US Breast Ltd Uni Left Inc Axilla  Addendum Date: 03/25/2018   ADDENDUM REPORT: 03/25/2018 15:21 ADDENDUM: The patient's prior outside mammograms from 2014 and 2016 have become available for comparison. This mass is felt to have been present and similar in appearance when compared to the prior outside mammograms, with the difference in recent size measurements on  ultrasound felt to be related to the lobulated nature of the mass . Recommendation: Recommend an additional follow-up diagnostic mammogram and ultrasound in 12 months. This was discussed with the patient 03/25/2018 at 3:20 p.m. BI-RADS category: 3: Probably benign. Electronically Signed   By: Everlean Alstrom M.D.   On: 03/25/2018 15:21   Result Date: 03/25/2018 CLINICAL DATA:  Follow-up for probably benign left breast mass. EXAM: DIGITAL DIAGNOSTIC BILATERAL MAMMOGRAM WITH CAD AND TOMO LEFT BREAST ULTRASOUND COMPARISON:  Previous exam(s). ACR Breast Density Category c: The breast tissue is heterogeneously dense, which may obscure small masses. FINDINGS: No suspicious masses or calcifications are seen in either breast. The oval mass in the upper-outer left appears overall unchanged mammographically. Mammographic images were processed with CAD. Targeted ultrasound of the upper-outer left breast was performed. The oval circumscribed gently lobulated mass in the left breast at the 2:30 position 7 cm from nipple measures 2.2 x 0.6 x 1.9 cm, previously measured 1.7 x 0.5 x 1.6 cm 04/09/2017 and measured 1.8 x 0.6 x 1.4 cm 10/08/2017. This mass appears to be increasing in size, although some of this appearance could be due to differences in imaging and measurement technique. No lymphadenopathy seen in the left axilla. IMPRESSION: Indeterminate mass in the left breast at the 2:30 position. RECOMMENDATION: Ultrasound-guided biopsy of the mass in the left breast at the 2:30 position is recommended. The patient would like to discuss the recommendation with her oncologist prior to scheduling the biopsy. The patient states she has prior outside mammograms from [place]. If these become available for comparison and show this mass to demonstrate long-term stability, then the recommended biopsy may potentially be canceled. I have discussed the findings and recommendations with the patient. Results were also provided in  writing at the conclusion of the visit. If applicable, a reminder letter will be sent to the patient regarding the next appointment. BI-RADS CATEGORY  4: Suspicious. Electronically Signed: By: Everlean Alstrom  M.D. On: 03/25/2018 12:10   Mm Diag Breast Tomo Bilateral  Addendum Date: 03/25/2018   ADDENDUM REPORT: 03/25/2018 15:21 ADDENDUM: The patient's prior outside mammograms from 2014 and 2016 have become available for comparison. This mass is felt to have been present and similar in appearance when compared to the prior outside mammograms, with the difference in recent size measurements on ultrasound felt to be related to the lobulated nature of the mass . Recommendation: Recommend an additional follow-up diagnostic mammogram and ultrasound in 12 months. This was discussed with the patient 03/25/2018 at 3:20 p.m. BI-RADS category: 3: Probably benign. Electronically Signed   By: Everlean Alstrom M.D.   On: 03/25/2018 15:21   Result Date: 03/25/2018 CLINICAL DATA:  Follow-up for probably benign left breast mass. EXAM: DIGITAL DIAGNOSTIC BILATERAL MAMMOGRAM WITH CAD AND TOMO LEFT BREAST ULTRASOUND COMPARISON:  Previous exam(s). ACR Breast Density Category c: The breast tissue is heterogeneously dense, which may obscure small masses. FINDINGS: No suspicious masses or calcifications are seen in either breast. The oval mass in the upper-outer left appears overall unchanged mammographically. Mammographic images were processed with CAD. Targeted ultrasound of the upper-outer left breast was performed. The oval circumscribed gently lobulated mass in the left breast at the 2:30 position 7 cm from nipple measures 2.2 x 0.6 x 1.9 cm, previously measured 1.7 x 0.5 x 1.6 cm 04/09/2017 and measured 1.8 x 0.6 x 1.4 cm 10/08/2017. This mass appears to be increasing in size, although some of this appearance could be due to differences in imaging and measurement technique. No lymphadenopathy seen in the left axilla. IMPRESSION:  Indeterminate mass in the left breast at the 2:30 position. RECOMMENDATION: Ultrasound-guided biopsy of the mass in the left breast at the 2:30 position is recommended. The patient would like to discuss the recommendation with her oncologist prior to scheduling the biopsy. The patient states she has prior outside mammograms from [place]. If these become available for comparison and show this mass to demonstrate long-term stability, then the recommended biopsy may potentially be canceled. I have discussed the findings and recommendations with the patient. Results were also provided in writing at the conclusion of the visit. If applicable, a reminder letter will be sent to the patient regarding the next appointment. BI-RADS CATEGORY  4: Suspicious. Electronically Signed: By: Everlean Alstrom M.D. On: 03/25/2018 12:10

## 2018-03-26 NOTE — Assessment & Plan Note (Signed)
Very pleasant 68 year old female with CLL presenting with intermittent diarrhea which appears to be related to certain foods.  Symptoms fairly well-controlled with food avoidance.  She has some mild bloating.  CT abdomen pelvis last year with hepatic cysts but nothing to explain abdominal bloating or diarrhea.  Unclear when her last colonoscopy was.  We have requested for records.  This may need updating.  As far as nausea, she denies at this time.  She has some nasal congestion and throat clearing when she wakes up only on days that she has worked.  When she clears her congestion she is fine rest the day.  Denies any typical heartburn.  Doubt we are dealing with reflux.  Likely environmental exposures/allergies.  Offered her a trial of PPI for 3 to 4 weeks to see if this would help but she knows to consider evaluation with PCP for non GI sources of her symptoms.  Trial of probiotic for bloating and bowel issues.  Once we obtain records we will determine timing of her next colonoscopy.

## 2018-03-26 NOTE — Progress Notes (Signed)
Diagnosis CLL (chronic lymphocytic leukemia) ([place]) - Plan: CBC with Differential/Platelet, Comprehensive metabolic panel, Lactate dehydrogenase, Ibrutinib (IMBRUVICA) 420 MG TABS  Abnormal findings on diagnostic imaging of lung - Plan: CT Chest Wo Contrast  Staging Cancer Staging No matching staging information was found for the patient.  Assessment and Plan:  1.CLL, 13q deletion.  Pt was diagnosed in 11/2011.  Patients with 13q deletion as sole chromosomal abnormality found on FISH have longest survival time.  Monitored for treatment parameters, until 2016 when she was treated with Bendamustine/Rituxan x 4 cycles from 08/02/14-09/01/14 with normalization of blood counts and lymphadenopathy.  Chemo course was complicated by hospitalization for fever requiring Bendamustin dose reduction.  Was noted to be in remission in 12/2014.  Relapsed disease noted in 06/2015 with changes in WBCs/lymphocyte count and LDH.  Ibrutinib started in 06/2015. Pt has history of questionable compliance on Imbruvica in the past.     Labs done 03/19/2018 reviewed and showed WBC 5.9 HB 12.3 plts 261,000.  Chemistries WNL with Cr 1 and normal LFTs.  LDH 154.  Pt reports she has been out of Imbruvica for 2 weeks.  Pt advised to notify office when she is running out of medication.  Rx Imbruvica 420 mg daily  sent to [facility].  I discussed with pt counts have been stable due to therapy.   She will RTC in 09/2018 with labs and follow-up.    2.  Abnormal mammogram.  Pt had screening mammogram done 03/20/2017 that showed a question of lesion in the right axilla and left breast.  She was recommended for left breast USN that was done on 04/09/2017 that showed  IMPRESSION: Benign sebaceous cyst in the right axilla.  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  RECOMMENDATION: Left breast ultrasound is recommended in 6 months.  left breast USN done 10/08/2017 reviewed and showed  : No significant change in a probable  fibroadenoma in the 2:30 o'clock position of the left breast.  Pt does not desire biopsy of area which was also discussed as an option.    Pt had bilateral diagnostic mammogram done 03/25/2018 that showed  IMPRESSION: Indeterminate mass in the left breast at the 2:30 position  The patient's prior outside mammograms from 2014 and 2016 have become available for comparison. This mass is felt to have been present and similar in appearance when compared to the prior outside mammograms, with the difference in recent size measurements on ultrasound felt to be related to the lobulated nature of the mass .  Recommendation: Recommend an additional follow-up diagnostic mammogram and ultrasound in 12 months.  Pt will be set up for imaging in 03/2019.  She should notify the office if any changes prior to next visit.    3.  Pulmonary nodule.  This was reported on CT scan done at an outside facility Jul 07, 2014.  Nodule measured 5 mm.  She has a history of smoking but reports she has quit smoking.  CT of the chest done 10/08/2017 reviewed and showed IMPRESSION: 1. Stable benign-appearing 5 mm noncalcified nodule in the periphery of the left lower lobe. Follow-up CT of the chest only if the patient is considered high risk in 1 year. 2. Faint coronary artery calcifications. 3. Mild centrilobular emphysema.  She is set up for CT chest without contrast in 09/2018 for ongoing follow-up.  She will RTC in 09/2018 for follow-up to go over results.    4.  Hypertension.  Blood pressure is 160/71.  Follow-up with PCP.  5.  Nausea/diarrhea.   Pt  had CT abdomen and pelvis done 06/2017 that showed liver cysts but no other abdominal processes.  She is unsure when her last colonoscopy was done.  PT has been referred to GI for evaluation.  If diarrhea recurs may have to modify dosing due to potential side effect of diarrhea with Imbruvica.      6.  Compliance with therapy.  Pt reports she is now taking medication as  directed.  Labs improved today.  Rx sent to pharmacy as pt reports she is out of medication.    25  minutes spent with more than 50% spent in counseling and coordination of care.    Interval History:  Historical data obtained from the note dated 06/14/2017.  CLL, 13q deletion.  Pt was diagnosed in 11/2011.  Patients with 13q deletion as sole chromosomal abnormality found on FISH have longest survival time.  Monitored for treatment parameters, until 2016 when she was treated with Bendamustine/Rituxan x 4 cycles from 08/02/14-09/01/14 with normalization of blood counts and lymphadenopathy.  Chemo course was complicated by hospitalization for fever requiring Bendamustin dose reduction.  Was noted to be in remission in 12/2014.  Relapsed disease noted in 06/2015 with changes in WBCs/lymphocyte count and LDH.  Ibrutinib started in 06/2015. Pt has history of questionable compliance on Imbruvica in the past.   Current Status:  Pt is seen today for follow-up to go over labs and mammogram.  She reports she has been out of Imbruvica for 2 weeks.      CLL (chronic lymphocytic leukemia) ([place])   12/16/2011 Initial Diagnosis    CLL (chronic lymphocytic leukemia), stage A ([place]), stage 0 ([place])    01/13/2013 Pathology Results    Monoclonal B cell population is detected with kapp light chain restriction, representing 82% of peripheral leukocytes. B cell population expresses CD19, CD20, CD11C, CD23, and aberrant CD5. No blasts.    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      03/30/2014 Cancer Staging    CLL Stage B ([place]), Stage I ([place]), now with doubling of WBC and approximate 6 months and widespread adenopathy, dropping hemoglobin, but not under 11 g/dL yet, fatigue and weakness    05/31/2014 Cancer Staging    Stage III by both [place] and [place] staging systems, HGB 10.6 g/dL.    07/07/2014 Imaging    CT CAP- extensive adenopathy w/in low  neck, chest, abdomen, and pelvis.  Centrilobular emphysema. Nonspecific 5 mm LLL pulmonary nodule. Nonspecific focal left-sided pleural nodularity. 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    08/02/2014 - 11/02/2014 Chemotherapy    Bendamustine/Rituxan x 4 cycles with normalization of HGB and WBC and PLT count with disappearance of all symptoms and lymph nodes    09/07/2014 Adverse Reaction    Hospitalization with fever and chills, acute bronchitis, negative blood cultures. Comlicated by Levaquin skin rash.  Bendamustine dose reduced for next cycle.    09/08/2014 Echocardiogram    Left ventricular wall motion and contractility are within normal limits.  The estimated ejection fraction is greater than 65%.  Normal left ventricular diastolic filling is observed.    12/21/2014 Treatment Plan Change    Chemotherapy discontinued following 4 cycles of therapy due to tremendouns response and disappearance of all symptoms and lymph nodes.    12/21/2014 Remission       06/22/2015 Relapse/Recurrence    Per Dr. [name]: "Now relapsing changes  in her WBC count, lymphocyte count, LDH level. Before she gets symptomatic again, I think is worthy of starting her on Ibrutinib while she has a great performance status..."    06/29/2015 -  Chemotherapy    Ibrutinib initiated      Problem List Patient Active Problem List   Diagnosis Date Noted  . HTN (hypertension) [I10] 09/15/2015  . Hypokalemia [E87.6] 06/22/2015  . 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 Port-A-Cath in place [facility] 12/21/2014  . CLL (chronic lymphocytic leukemia) ([place]) [C91.10] 03/30/2014  . Essential hypertension [I10] 12/21/2011  . Hypercholesterolemia [E78.00] 12/21/2011  . Osteopenia [M85.80] 12/21/2011  . Tobacco abuse [Z72.0] 12/21/2011  . Alcohol dependence, continuous ([place]) [F10.20] 05/15/2004    Past Medical History Past Medical History:  Diagnosis Date  . Alcohol dependence, continuous ([place])  05/15/2004   Overview:  ICD10 Conversion  . Anemia   . 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 CLL (chronic lymphocytic leukemia) ([place])   . Depression   . Diabetes mellitus without complication ([place])   . Hypertension   . Hypokalemia   . Leukemia ([place])   . Tobacco abuse 12/21/2011   Quit in July 2016    Past Surgical History History reviewed. No pertinent surgical history.  Family History Family History  Problem Relation Age of Onset  . Heart failure Mother   . Diabetes Mother   . Hypertension Mother   . Heart failure Father      Social History  reports that she quit smoking about 3 years ago. Her smoking use included cigarettes. She has a 45.00 pack-year smoking history. She has quit using smokeless tobacco. She reports current alcohol use of about 2.0 standard drinks of alcohol per week. She reports that she does not use drugs.  Medications  Current Outpatient Medications:  .  ALPRAZolam (XANAX) 0.25 MG tablet, Take one tablet by mouth every 6 to 8 hours as needed for anxiety., Disp: , Rfl:  .  atorvastatin (LIPITOR) 20 MG tablet, , Disp: , Rfl:  .  fenofibrate (TRICOR) 145 MG tablet, Take 145 mg by mouth daily., Disp: , Rfl: 1 .  Iron-Vitamins (GERITOL COMPLETE) TABS, Take by mouth., Disp: , Rfl:  .  lidocaine-prilocaine (EMLA) cream, Apply 1 application topically as needed., Disp: 30 g, Rfl: 1 .  lisinopril-hydrochlorothiazide (PRINZIDE,ZESTORETIC) 20-12.5 MG tablet, Take 1 tablet by mouth daily. , Disp: , Rfl:  .  Multiple Vitamins-Minerals (THERA-M) TABS, Take by mouth., Disp: , Rfl:  .  ondansetron (ZOFRAN) 8 MG tablet, Take 1 tablet (8 mg total) by mouth every 8 (eight) hours as needed for nausea or vomiting., Disp: 30 tablet, Rfl: 3 .  potassium chloride SA (K-DUR,KLOR-CON) 20 MEQ tablet, Take 2 tablets (40 mEq total) by mouth 2 (two) times daily., Disp: 30 tablet, Rfl: 0 .  prochlorperazine (COMPAZINE) 10 MG tablet, Take 1 tablet (10 mg total) by mouth every 6  (six) hours as needed for nausea or vomiting., Disp: 30 tablet, Rfl: 3 .  Ibrutinib (IMBRUVICA) 420 MG TABS, Take 420 mg by mouth daily., Disp: 28 tablet, Rfl: 0 No current facility-administered medications for this visit.   Facility-Administered Medications Ordered in Other Visits:  .  sodium chloride flush (NS) 0.9 % injection 10 mL, 10 mL, Intravenous, PRN, Derek Jack, MD, 10 mL at 03/19/18 1137  Allergies Aspirin and Levaquin  [levofloxacin in d5w]  Review of Systems Review of Systems - Oncology ROS negative   Physical Exam  Vitals Wt Readings from Last 3 Encounters:  03/26/18 173 lb (78.5 kg)  10/16/17 171 lb 4.8 oz (77.7 kg)  09/18/17 170 lb 11.2 oz (77.4 kg)   Temp Readings from Last 3 Encounters:  03/26/18 98.1 F (36.7 C) (Oral)  03/19/18 97.6 F (36.4 C) (Oral)  12/18/17 97.8 F (36.6 C) (Oral)   BP Readings from Last 3 Encounters:  03/26/18 (!) 160/71  03/19/18 (!) 159/89  12/18/17 129/73   Pulse Readings from Last 3 Encounters:  03/26/18 61  03/19/18 73  12/18/17 70   Constitutional: Well-developed, well-nourished, and in no distress.   HENT: Head: Normocephalic and atraumatic.  Mouth/Throat: No oropharyngeal exudate. Mucosa moist. Eyes: Pupils are equal, round, and reactive to light. Conjunctivae are normal. No scleral icterus.  Neck: Normal range of motion. Neck supple. No JVD present.  Cardiovascular: Normal rate, regular rhythm and normal heart sounds.  Exam reveals no gallop and no friction rub.   No murmur heard. Pulmonary/Chest: Effort normal and breath sounds normal. No respiratory distress. No wheezes.No rales.  Abdominal: Soft. Bowel sounds are normal. No distension. There is no tenderness. There is no guarding.  Musculoskeletal: No edema or tenderness.  Lymphadenopathy: No cervical, axillary or supraclavicular adenopathy.  Neurological: Alert and oriented to person, place, and time. No cranial nerve deficit.  Skin: Skin is warm and  dry. No rash noted. No erythema. No pallor.  Psychiatric: Affect and judgment normal.   Labs No visits with results within 3 Day(s) from this visit.  Latest known visit with results is:  Infusion on 03/19/2018  Component Date Value Ref Range Status  . LDH 03/19/2018 154  98 - 192 U/L Final   Performed at [facility], [address], [place], [place] [address]  . Sodium 03/19/2018 136  135 - 145 mmol/L Final  . Potassium 03/19/2018 3.2* 3.5 - 5.1 mmol/L Final  . Chloride 03/19/2018 103  98 - 111 mmol/L Final  . CO2 03/19/2018 23  22 - 32 mmol/L Final  . Glucose, Bld 03/19/2018 117* 70 - 99 mg/dL Final  . BUN 03/19/2018 26* 8 - 23 mg/dL Final  . Creatinine, Ser 03/19/2018 1.01* 0.44 - 1.00 mg/dL Final  . Calcium 03/19/2018 9.4  8.9 - 10.3 mg/dL Final  . Total Protein 03/19/2018 7.3  6.5 - 8.1 g/dL Final  . Albumin 03/19/2018 4.3  3.5 - 5.0 g/dL Final  . AST 03/19/2018 19  15 - 41 U/L Final  . ALT 03/19/2018 17  0 - 44 U/L Final  . Alkaline Phosphatase 03/19/2018 40  38 - 126 U/L Final  . Total Bilirubin 03/19/2018 0.3  0.3 - 1.2 mg/dL Final  . GFR calc non Af Amer 03/19/2018 58* >60 mL/min Final  . GFR calc Af Amer 03/19/2018 >60  >60 mL/min Final  . Anion gap 03/19/2018 10  5 - 15 Final   Performed at [facility], [address] [place],  [address]  . WBC 03/19/2018 5.9  4.0 - 10.5 K/uL Final  . RBC 03/19/2018 4.55  3.87 - 5.11 MIL/uL Final  . Hemoglobin 03/19/2018 12.3  12.0 - 15.0 g/dL Final  . HCT 03/19/2018 39.2  36.0 - 46.0 % Final  . MCV 03/19/2018 86.2  80.0 - 100.0 fL Final  . MCH 03/19/2018 27.0  26.0 - 34.0 pg Final  . MCHC 03/19/2018 31.4  30.0 - 36.0 g/dL Final  . RDW 03/19/2018 13.9  11.5 - 15.5 % Final  . Platelets 03/19/2018 261  150 - 400 K/uL Final  . nRBC 03/19/2018 0.0  0.0 - 0.2 % Final  . Neutrophils Relative % 03/19/2018 28  % Final  . Neutro Abs 03/19/2018 1.6* 1.7 - 7.7 K/uL Final  . Lymphocytes Relative 03/19/2018 60  % Final  . Lymphs Abs  03/19/2018 3.5  0.7 - 4.0 K/uL Final  . Monocytes Relative 03/19/2018 8  % Final  . Monocytes Absolute 03/19/2018 0.5  0.1 - 1.0 K/uL Final  . Eosinophils Relative 03/19/2018 3  % Final  . Eosinophils Absolute 03/19/2018 0.2  0.0 - 0.5 K/uL Final  . Basophils Relative 03/19/2018 1  % Final  . Basophils Absolute 03/19/2018 0.1  0.0 - 0.1 K/uL Final  . Immature Granulocytes 03/19/2018 0  % Final  . Abs Immature Granulocytes 03/19/2018 0.01  0.00 - 0.07 K/uL Final   Performed at [facility], [address], [place], [place] [address]     Pathology Orders Placed This Encounter  Procedures  . CT Chest Wo Contrast    Standing Status:   Future    Standing Expiration Date:   03/26/2019    Order Specific Question:   Preferred imaging location?    Answer:   [facility]    Order Specific Question:   Radiology Contrast Protocol - do NOT remove file path    Answer:   [email or web address]  . CBC with Differential/Platelet    Standing Status:   Future    Standing Expiration Date:   03/27/2019  . Comprehensive metabolic panel    Standing Status:   Future    Standing Expiration Date:   03/27/2019  . Lactate dehydrogenase    Standing Status:   Future    Standing Expiration Date:   03/27/2019       Zoila Shutter MD

## 2018-03-28 ENCOUNTER — Telehealth (HOSPITAL_COMMUNITY): Payer: Self-pay | Admitting: Pharmacy Technician

## 2018-03-28 NOTE — Telephone Encounter (Signed)
Oral Oncology Patient Advocate Encounter   Was successful in obtaining a copay card for [place].  This copay card will make the patients copay $35.  The billing information is as follows and has been shared with [place].   RxBin: [id number] PCN: none Member ID: [id number] Group ID: [id number]   [place] Nancy Coqui Patient [place] Phone [phone] Fax [phone] 03/28/2018 3:44 PM

## 2018-03-28 NOTE — Progress Notes (Signed)
cc'ed to pcp °

## 2018-04-02 NOTE — Telephone Encounter (Signed)
Oral Oncology Patient Advocate Encounter  Called and spoke with [place] to check status of Imbruvica.  Medication is scheduled to be delivered today, 2/12.  [employer] tracking number: [id number]  Brittany Rowland Patient [place] Phone [phone] Fax [phone] 04/02/2018 10:38 AM

## 2018-04-22 ENCOUNTER — Telehealth: Payer: Self-pay | Admitting: Gastroenterology

## 2018-04-22 NOTE — Telephone Encounter (Signed)
Reviewed records from [place].  Colonoscopy 02/2013, normal.   Please NIC for TCS 02/2023.  Let patient know next tCS due 02/2023.  OV here prn if persistent symptoms.

## 2018-04-28 NOTE — Telephone Encounter (Signed)
REVIEWED. AGREE. NO ADDITIONAL RECOMMENDATIONS. 

## 2018-05-28 ENCOUNTER — Telehealth (HOSPITAL_COMMUNITY): Payer: Self-pay

## 2018-05-28 NOTE — Telephone Encounter (Signed)
Received a fax communication from [facility] [employer] for a refill on Imburvica.  The specialty pharmacy has attempted twice to contact the patient with no answer.  Double checked the patients telephone number with the fax and they are the same.  Called the number on file and left a message for the patient to call the pharmacy to continue her refills.  Number for the pharmacy left on the patients voice mail.    The patient works third shift at [employer] in [place], [place].

## 2018-06-18 ENCOUNTER — Encounter (HOSPITAL_COMMUNITY): Payer: BLUE CROSS/BLUE SHIELD

## 2018-07-09 ENCOUNTER — Other Ambulatory Visit: Payer: Self-pay

## 2018-07-09 ENCOUNTER — Inpatient Hospital Stay (HOSPITAL_COMMUNITY): Payer: BLUE CROSS/BLUE SHIELD | Attending: Hematology

## 2018-07-09 DIAGNOSIS — C9190 Lymphoid leukemia, unspecified not having achieved remission: Secondary | ICD-10-CM | POA: Diagnosis not present

## 2018-07-09 DIAGNOSIS — Z452 Encounter for adjustment and management of vascular access device: Secondary | ICD-10-CM | POA: Diagnosis not present

## 2018-07-09 MED ORDER — HEPARIN SOD (PORK) LOCK FLUSH 100 UNIT/ML IV SOLN
500.0000 [IU] | Freq: Once | INTRAVENOUS | Status: AC
  Administered 2018-07-09: 500 [IU] via INTRAVENOUS

## 2018-07-09 MED ORDER — SODIUM CHLORIDE 0.9% FLUSH
10.0000 mL | Freq: Once | INTRAVENOUS | Status: AC
  Administered 2018-07-09: 10 mL

## 2018-07-09 NOTE — Progress Notes (Signed)
Brittany Rowland presented today for port flush .  Portacath flushed with 45ml NS and 500U/44ml Heparin per protocol and needle removed intact. Procedure without incident. Patient tolerated procedure well.   No complaints at this time. Discharged from clinic ambulatory. F/U with [facility] as scheduled.

## 2018-08-01 ENCOUNTER — Encounter (HOSPITAL_COMMUNITY): Payer: Self-pay | Admitting: *Deleted

## 2018-08-01 ENCOUNTER — Other Ambulatory Visit (HOSPITAL_COMMUNITY): Payer: Self-pay | Admitting: Hematology

## 2018-08-01 ENCOUNTER — Other Ambulatory Visit (HOSPITAL_COMMUNITY): Payer: Self-pay | Admitting: *Deleted

## 2018-08-01 NOTE — Progress Notes (Signed)
I received notification from [facility] stating that they have attempted 3 times to contact patient in regards to her Imbruvica refills.  They have deactivated the prescription until the patient calls back.   I contacted the patient via telephone today and she said that she still has some medication left and usually calls around the 10th of the month to refill the Imbruvica but hasn't called because she still has some.  I advised her to call them today so that they can get a refill request sent in should they need it and I advised her to try and take the medication each day at the same time to prevent missed doses. She verbalizes understanding and says she will call the pharmacy when we hang up.

## 2018-09-01 ENCOUNTER — Other Ambulatory Visit (HOSPITAL_COMMUNITY): Payer: Self-pay | Admitting: Hematology

## 2018-10-02 ENCOUNTER — Other Ambulatory Visit (HOSPITAL_COMMUNITY): Payer: Self-pay | Admitting: Hematology

## 2018-10-07 ENCOUNTER — Other Ambulatory Visit (HOSPITAL_COMMUNITY): Payer: Self-pay | Admitting: *Deleted

## 2018-10-07 DIAGNOSIS — C9110 Chronic lymphocytic leukemia of B-cell type not having achieved remission: Secondary | ICD-10-CM

## 2018-10-07 DIAGNOSIS — R918 Other nonspecific abnormal finding of lung field: Secondary | ICD-10-CM

## 2018-10-08 ENCOUNTER — Inpatient Hospital Stay (HOSPITAL_COMMUNITY): Payer: BC Managed Care – PPO | Attending: Hematology

## 2018-10-08 ENCOUNTER — Other Ambulatory Visit: Payer: Self-pay

## 2018-10-08 ENCOUNTER — Ambulatory Visit (HOSPITAL_COMMUNITY)
Admission: RE | Admit: 2018-10-08 | Discharge: 2018-10-08 | Disposition: A | Discharge status: Home / Self Care | Payer: BC Managed Care – PPO | Source: Ambulatory Visit | Attending: Internal Medicine | Admitting: Internal Medicine

## 2018-10-08 DIAGNOSIS — C9110 Chronic lymphocytic leukemia of B-cell type not having achieved remission: Secondary | ICD-10-CM | POA: Insufficient documentation

## 2018-10-08 DIAGNOSIS — Z87891 Personal history of nicotine dependence: Secondary | ICD-10-CM | POA: Insufficient documentation

## 2018-10-08 DIAGNOSIS — R918 Other nonspecific abnormal finding of lung field: Secondary | ICD-10-CM | POA: Insufficient documentation

## 2018-10-08 DIAGNOSIS — R05 Cough: Secondary | ICD-10-CM | POA: Insufficient documentation

## 2018-10-08 DIAGNOSIS — I10 Essential (primary) hypertension: Secondary | ICD-10-CM | POA: Insufficient documentation

## 2018-10-08 DIAGNOSIS — Z79899 Other long term (current) drug therapy: Secondary | ICD-10-CM | POA: Insufficient documentation

## 2018-10-08 DIAGNOSIS — E119 Type 2 diabetes mellitus without complications: Secondary | ICD-10-CM | POA: Diagnosis not present

## 2018-10-08 DIAGNOSIS — Z9221 Personal history of antineoplastic chemotherapy: Secondary | ICD-10-CM | POA: Insufficient documentation

## 2018-10-08 DIAGNOSIS — Z923 Personal history of irradiation: Secondary | ICD-10-CM | POA: Insufficient documentation

## 2018-10-08 DIAGNOSIS — R197 Diarrhea, unspecified: Secondary | ICD-10-CM | POA: Diagnosis not present

## 2018-10-08 LAB — COMPREHENSIVE METABOLIC PANEL
ALT: 17 U/L (ref 0–44)
AST: 18 U/L (ref 15–41)
Albumin: 4 g/dL (ref 3.5–5.0)
Alkaline Phosphatase: 45 U/L (ref 38–126)
Anion gap: 9 (ref 5–15)
BUN: 24 mg/dL — ABNORMAL HIGH (ref 8–23)
CO2: 24 mmol/L (ref 22–32)
Calcium: 9.1 mg/dL (ref 8.9–10.3)
Chloride: 107 mmol/L (ref 98–111)
Creatinine, Ser: 1.07 mg/dL — ABNORMAL HIGH (ref 0.44–1.00)
GFR calc Af Amer: >60 mL/min (ref >60)
GFR calc non Af Amer: 54 mL/min — ABNORMAL LOW (ref >60)
Glucose, Bld: 93 mg/dL (ref 70–99)
Potassium: 3.7 mmol/L (ref 3.5–5.1)
Sodium: 140 mmol/L (ref 135–145)
Total Bilirubin: 0.8 mg/dL (ref 0.3–1.2)
Total Protein: 7.1 g/dL (ref 6.5–8.1)

## 2018-10-08 LAB — CBC WITH DIFFERENTIAL/PLATELET
Abs Immature Granulocytes: 0.05 10*3/uL (ref 0.00–0.07)
Basophils Absolute: 0.1 10*3/uL (ref 0.0–0.1)
Basophils Relative: 1 %
Eosinophils Absolute: 0.2 10*3/uL (ref 0.0–0.5)
Eosinophils Relative: 3 %
HCT: 37.9 % (ref 36.0–46.0)
Hemoglobin: 12.1 g/dL (ref 12.0–15.0)
Immature Granulocytes: 1 %
Lymphocytes Relative: 63 %
Lymphs Abs: 4.3 10*3/uL — ABNORMAL HIGH (ref 0.7–4.0)
MCH: 27.6 pg (ref 26.0–34.0)
MCHC: 31.9 g/dL (ref 30.0–36.0)
MCV: 86.3 fL (ref 80.0–100.0)
Monocytes Absolute: 0.6 10*3/uL (ref 0.1–1.0)
Monocytes Relative: 8 %
Neutro Abs: 1.6 10*3/uL — ABNORMAL LOW (ref 1.7–7.7)
Neutrophils Relative %: 24 %
Platelets: 261 10*3/uL (ref 150–400)
RBC: 4.39 MIL/uL (ref 3.87–5.11)
RDW: 14.4 % (ref 11.5–15.5)
WBC: 6.8 10*3/uL (ref 4.0–10.5)
nRBC: 0.3 % — ABNORMAL HIGH (ref 0.0–0.2)

## 2018-10-08 LAB — LACTATE DEHYDROGENASE: LDH: 167 U/L (ref 98–192)

## 2018-10-15 ENCOUNTER — Encounter (HOSPITAL_COMMUNITY): Payer: Self-pay | Admitting: Hematology

## 2018-10-15 ENCOUNTER — Inpatient Hospital Stay (HOSPITAL_BASED_OUTPATIENT_CLINIC_OR_DEPARTMENT_OTHER): Payer: BC Managed Care – PPO | Admitting: Hematology

## 2018-10-15 ENCOUNTER — Inpatient Hospital Stay (HOSPITAL_COMMUNITY): Payer: BC Managed Care – PPO

## 2018-10-15 ENCOUNTER — Other Ambulatory Visit: Payer: Self-pay

## 2018-10-15 VITALS — BP 148/69 | HR 70 | Temp 97.1°F | Resp 16 | Wt 171.0 lb

## 2018-10-15 DIAGNOSIS — R918 Other nonspecific abnormal finding of lung field: Secondary | ICD-10-CM

## 2018-10-15 DIAGNOSIS — C9110 Chronic lymphocytic leukemia of B-cell type not having achieved remission: Secondary | ICD-10-CM

## 2018-10-15 MED ORDER — HEPARIN SOD (PORK) LOCK FLUSH 100 UNIT/ML IV SOLN
500.0000 [IU] | Freq: Once | INTRAVENOUS | Status: AC
  Administered 2018-10-15: 500 [IU] via INTRAVENOUS

## 2018-10-15 MED ORDER — SODIUM CHLORIDE 0.9% FLUSH
10.0000 mL | Freq: Once | INTRAVENOUS | Status: AC
  Administered 2018-10-15: 10 mL

## 2018-10-15 NOTE — Patient Instructions (Addendum)
[facility] at [facility] Discharge Instructions  You were seen today by Dr. Katragadda. He went over your recent lab results. He will see you back in 4 months for labs and follow up.   Thank you for choosing [facility] at [facility] to provide your oncology and hematology care.  To afford each patient quality time with our provider, please arrive at least 15 minutes before your scheduled appointment time.   If you have a lab appointment with the Cancer Center please come in thru the  Main Entrance and check in at the main information desk  You need to re-schedule your appointment should you arrive 10 or more minutes late.  We strive to give you quality time with our providers, and arriving late affects you and other patients whose appointments are after yours.  Also, if you no show three or more times for appointments you may be dismissed from the clinic at the providers discretion.     Again, thank you for choosing [facility].  Our hope is that these requests will decrease the amount of time that you wait before being seen by our physicians.       _____________________________________________________________  Should you have questions after your visit to [facility], please contact our office at [phone] between the hours of 8:00 a.m. and 4:30 p.m.  Voicemails left after 4:00 p.m. will not be returned until the following business day.  For prescription refill requests, have your pharmacy contact our office and allow 72 hours.    Cancer Center Support Programs:   > Cancer Support Group  2nd Tuesday of the month 1pm-2pm, Journey Room    

## 2018-10-15 NOTE — Progress Notes (Signed)
[address], [place] [address]   CLINIC:  Medical Oncology/Hematology  PCP:  Leory Plowman [facility] [place] [address] [phone]   REASON FOR VISIT:  Follow-up for CLL.  CURRENT THERAPY: Ibrutinib 420 mg daily.  BRIEF ONCOLOGIC HISTORY:  Oncology History  CLL (chronic lymphocytic leukemia) ([place])  12/16/2011 Initial Diagnosis   CLL (chronic lymphocytic leukemia), stage A (Binet), stage 0 (Rai)   01/13/2013 Pathology Results   Monoclonal B cell population is detected with kapp light chain restriction, representing 82% of peripheral leukocytes. B cell population expresses CD19, CD20, CD11C, CD23, and aberrant CD5. No blasts.   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     03/30/2014 Cancer Staging   CLL Stage B (Binet), Stage I (Rai), now with doubling of WBC and approximate 6 months and widespread adenopathy, dropping hemoglobin, but not under 11 g/dL yet, fatigue and weakness   05/31/2014 Cancer Staging   Stage III by both Binet and Rai staging systems, HGB 10.6 g/dL.   07/07/2014 Imaging   CT CAP- extensive adenopathy w/in low neck, chest, abdomen, and pelvis.  Centrilobular emphysema. Nonspecific 5 mm LLL pulmonary nodule. Nonspecific focal left-sided pleural nodularity. 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   08/02/2014 - 11/02/2014 Chemotherapy   Bendamustine/Rituxan x 4 cycles with normalization of HGB and WBC and PLT count with disappearance of all symptoms and lymph nodes   09/07/2014 Adverse Reaction   Hospitalization with fever and chills, acute bronchitis, negative blood cultures. Comlicated by Levaquin skin rash.  Bendamustine dose reduced for next cycle.   09/08/2014 Echocardiogram   Left ventricular wall motion and contractility are within normal limits.  The estimated ejection fraction is greater than  65%.  Normal left ventricular diastolic filling is observed.   12/21/2014 Treatment Plan Change   Chemotherapy discontinued following 4 cycles of therapy due to tremendouns response and disappearance of all symptoms and lymph nodes.   12/21/2014 Remission     06/22/2015 Relapse/Recurrence   Per Dr. [name]: "Now relapsing changes in her WBC count, lymphocyte count, LDH level. Before she gets symptomatic again, I think is worthy of starting her on Ibrutinib while she has a great performance status..."   06/29/2015 -  Chemotherapy   Ibrutinib initiated      CANCER STAGING: Cancer Staging No matching staging information was found for the patient.   INTERVAL HISTORY:  Ms. Earnheart 68 y.o. female seen for follow-up of CLL.  She is tolerating ibrutinib 420 mg reasonably well.  Chronic cough is present which is stable.  Has 1 or 2 episodes of loose diarrhea per week.  Denies any bleeding issues.  However has easy bruising.  Appetite is 100%.  Energy levels are 50%.  Pain is reported as 0.    REVIEW OF SYSTEMS:  Review of Systems  Respiratory: Positive for cough.   Gastrointestinal: Positive for diarrhea.  All other systems reviewed and are negative.    PAST MEDICAL/SURGICAL HISTORY:  Past Medical History:  Diagnosis Date  . Alcohol dependence, continuous ([place]) 05/15/2004   Overview:  ICD10 Conversion  . Anemia   . 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 CLL (chronic lymphocytic leukemia) ([place])   . Depression   . Diabetes mellitus without complication ([place])   . Hypertension   . Hypokalemia   . Leukemia ([place])   . Tobacco abuse 12/21/2011   Quit  in July 2016   Past Surgical History:  Procedure Laterality Date  . ABDOMINAL HYSTERECTOMY    . PORT A CATH INJECTION ([facility] HX)       SOCIAL HISTORY:  Social History   Socioeconomic History  . Marital status: Married    Spouse name: Not on file  . Number of children: Not on file  . Years of education: Not on file  .  Highest education level: Not on file  Occupational History  . Not on file  Social Needs  . Financial resource strain: Not on file  . Food insecurity    Worry: Not on file    Inability: Not on file  . Transportation needs    Medical: Not on file    Non-medical: Not on file  Tobacco Use  . Smoking status: Former Smoker    Packs/day: 1.00    Years: 45.00    Pack years: 45.00    Types: Cigarettes    Quit date: 09/15/2014    Years since quitting: 4.0  . Smokeless tobacco: Former Network engineer and Sexual Activity  . Alcohol use: Yes    Alcohol/week: 2.0 standard drinks    Types: 2 Shots of liquor per week  . Drug use: No  . Sexual activity: Not on file  Lifestyle  . Physical activity    Days per week: Not on file    Minutes per session: Not on file  . Stress: Not on file  Relationships  . Social Herbalist on phone: Not on file    Gets together: Not on file    Attends religious service: Not on file    Active member of club or organization: Not on file    Attends meetings of clubs or organizations: Not on file    Relationship status: Not on file  . Intimate partner violence    Fear of current or ex partner: Not on file    Emotionally abused: Not on file    Physically abused: Not on file    Forced sexual activity: Not on file  Other Topics Concern  . Not on file  Social History Narrative  . Not on file    FAMILY HISTORY:  Family History  Problem Relation Age of Onset  . Heart failure Mother   . Diabetes Mother   . Hypertension Mother   . Heart failure Father   . Colon cancer Neg Hx     CURRENT MEDICATIONS:  Outpatient Encounter Medications as of 10/15/2018  Medication Sig Note  . atorvastatin (LIPITOR) 20 MG tablet    . IMBRUVICA 420 MG TABS TAKE ONE TABLET BY MOUTH ONCE DAILY   . lidocaine-prilocaine (EMLA) cream Apply 1 application topically as needed.   Marland Kitchen lisinopril-hydrochlorothiazide (PRINZIDE,ZESTORETIC) 20-12.5 MG tablet Take 1 tablet by  mouth daily.    . Multiple Vitamins-Minerals (THERA-M) TABS Take by mouth. 09/15/2015: Received from: [place]: Take 1 tablet by mouth daily.  . potassium chloride SA (K-DUR,KLOR-CON) 20 MEQ tablet Take 2 tablets (40 mEq total) by mouth 2 (two) times daily.   . [DISCONTINUED] fenofibrate (TRICOR) 145 MG tablet Take 145 mg by mouth daily.    Facility-Administered Encounter Medications as of 10/15/2018  Medication  . sodium chloride flush (NS) 0.9 % injection 10 mL    ALLERGIES:  Allergies  Allergen Reactions  . Aspirin Nausea And Vomiting  . Levaquin  [Levofloxacin In D5w] Rash     PHYSICAL EXAM:  ECOG Performance status: 1  Vitals:   10/15/18 1135  BP: (!) 148/69  Pulse: 70  Resp: 16  Temp: (!) 97.1 F (36.2 C)  SpO2: 99%   Filed Weights   10/15/18 1135  Weight: 171 lb (77.6 kg)    Physical Exam Vitals signs reviewed.  Constitutional:      Appearance: Normal appearance.  Cardiovascular:     Rate and Rhythm: Normal rate and regular rhythm.     Heart sounds: Normal heart sounds.  Pulmonary:     Effort: Pulmonary effort is normal.     Breath sounds: Normal breath sounds.  Abdominal:     General: There is no distension.     Palpations: Abdomen is soft. There is no mass.  Musculoskeletal:        General: No swelling.  Lymphadenopathy:     Cervical: No cervical adenopathy.  Skin:    General: Skin is warm.  Neurological:     General: No focal deficit present.     Mental Status: She is alert and oriented to person, place, and time.  Psychiatric:        Mood and Affect: Mood normal.        Behavior: Behavior normal.      LABORATORY DATA:  I have reviewed the labs as listed.  CBC    Component Value Date/Time   WBC 6.8 10/08/2018 0913   RBC 4.39 10/08/2018 0913   HGB 12.1 10/08/2018 0913   HCT 37.9 10/08/2018 0913   PLT 261 10/08/2018 0913   MCV 86.3 10/08/2018 0913   MCH 27.6 10/08/2018 0913   MCHC 31.9 10/08/2018 0913   RDW 14.4  10/08/2018 0913   LYMPHSABS 4.3 (H) 10/08/2018 0913   MONOABS 0.6 10/08/2018 0913   EOSABS 0.2 10/08/2018 0913   BASOSABS 0.1 10/08/2018 0913   CMP Latest Ref Rng & Units 10/08/2018 03/19/2018 12/18/2017  Glucose 70 - 99 mg/dL 93 117(H) 94  BUN 8 - 23 mg/dL 24(H) 26(H) 22  Creatinine 0.44 - 1.00 mg/dL 1.07(H) 1.01(H) 1.03(H)  Sodium 135 - 145 mmol/L 140 136 140  Potassium 3.5 - 5.1 mmol/L 3.7 3.2(L) 3.2(L)  Chloride 98 - 111 mmol/L 107 103 108  CO2 22 - 32 mmol/L 24 23 21(L)  Calcium 8.9 - 10.3 mg/dL 9.1 9.4 9.1  Total Protein 6.5 - 8.1 g/dL 7.1 7.3 6.7  Total Bilirubin 0.3 - 1.2 mg/dL 0.8 0.3 0.8  Alkaline Phos 38 - 126 U/L 45 40 37(L)  AST 15 - 41 U/L 18 19 19   ALT 0 - 44 U/L 17 17 19        DIAGNOSTIC IMAGING:  I have independently reviewed the scans and discussed with the patient.    ASSESSMENT & PLAN:   CLL (chronic lymphocytic leukemia) ([place]) 1.  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 - Ibrutinib 420 mg daily started in May 2017. - She reports diarrhea with soft stools 2 times per week.  Denies any muscular pains.  Denies any headaches. - Reports easy bruising but denies any bleeding issues. 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  Physical exam today did not reveal any palpable adenopathy or splenomegaly. -We reviewed her lab results which are grossly within normal limits. - She will continue ibrutinib at the same dose level.  We will reevaluate her in 4 months for follow-up.  2.  Left lower lobe lung nodule: -CT scan of the chest on 10/08/2018 shows 5 mm left lower lobe  lung nodule which is stable, most likely benign.  Total time spent is 25 minutes with more than 50% of the time spent face-to-face discussing treatment plan, counseling and coordination of care.    Orders placed this encounter:  Orders Placed This Encounter  Procedures  . CBC with Differential/Platelet  . Comprehensive  metabolic panel  . Lactate dehydrogenase      Derek Jack, MD [place] [phone]

## 2018-10-15 NOTE — Assessment & Plan Note (Addendum)
1.  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 - Ibrutinib 420 mg daily started in May 2017. - She reports diarrhea with soft stools 2 times per week.  Denies any muscular pains.  Denies any headaches. - Reports easy bruising but denies any bleeding issues. 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  Physical exam today did not reveal any palpable adenopathy or splenomegaly. -We reviewed her lab results which are grossly within normal limits. - She will continue ibrutinib at the same dose level.  We will reevaluate her in 4 months for follow-up.  2.  Left lower lobe lung nodule: -CT scan of the chest on 10/08/2018 shows 5 mm left lower lobe lung nodule which is stable, most likely benign.

## 2018-10-15 NOTE — Progress Notes (Signed)
Brittany Rowland presented for Portacath access and flush. Portacath located in the right chest wall accessed with  H 20 needle. Clean, Dry and Intact Good blood return present. Portacath flushed with 38ml NS and 500U/73ml Heparin per protocol and needle removed intact. Procedure without incident. Patient tolerated procedure well.

## 2019-01-27 ENCOUNTER — Other Ambulatory Visit (HOSPITAL_COMMUNITY): Payer: BC Managed Care – PPO

## 2019-02-04 ENCOUNTER — Other Ambulatory Visit (HOSPITAL_COMMUNITY): Payer: BC Managed Care – PPO

## 2019-02-10 ENCOUNTER — Ambulatory Visit (HOSPITAL_COMMUNITY): Payer: BC Managed Care – PPO | Admitting: Hematology

## 2019-02-18 ENCOUNTER — Other Ambulatory Visit: Payer: Self-pay

## 2019-02-18 ENCOUNTER — Inpatient Hospital Stay (HOSPITAL_COMMUNITY): Payer: BC Managed Care – PPO | Attending: Nurse Practitioner

## 2019-02-18 DIAGNOSIS — C9110 Chronic lymphocytic leukemia of B-cell type not having achieved remission: Secondary | ICD-10-CM | POA: Diagnosis not present

## 2019-02-18 LAB — CBC WITH DIFFERENTIAL/PLATELET
Abs Immature Granulocytes: 0.01 10*3/uL (ref 0.00–0.07)
Basophils Absolute: 0.1 10*3/uL (ref 0.0–0.1)
Basophils Relative: 1 %
Eosinophils Absolute: 1.1 10*3/uL — ABNORMAL HIGH (ref 0.0–0.5)
Eosinophils Relative: 16 %
HCT: 40 % (ref 36.0–46.0)
Hemoglobin: 12.7 g/dL (ref 12.0–15.0)
Immature Granulocytes: 0 %
Lymphocytes Relative: 50 %
Lymphs Abs: 3.3 10*3/uL (ref 0.7–4.0)
MCH: 27 pg (ref 26.0–34.0)
MCHC: 31.8 g/dL (ref 30.0–36.0)
MCV: 85.1 fL (ref 80.0–100.0)
Monocytes Absolute: 0.5 10*3/uL (ref 0.1–1.0)
Monocytes Relative: 8 %
Neutro Abs: 1.6 10*3/uL — ABNORMAL LOW (ref 1.7–7.7)
Neutrophils Relative %: 25 %
Platelets: 324 10*3/uL (ref 150–400)
RBC: 4.7 MIL/uL (ref 3.87–5.11)
RDW: 14.4 % (ref 11.5–15.5)
WBC: 6.6 10*3/uL (ref 4.0–10.5)
nRBC: 0 % (ref 0.0–0.2)

## 2019-02-18 LAB — COMPREHENSIVE METABOLIC PANEL
ALT: 19 U/L (ref 0–44)
AST: 19 U/L (ref 15–41)
Albumin: 3.9 g/dL (ref 3.5–5.0)
Alkaline Phosphatase: 50 U/L (ref 38–126)
Anion gap: 11 (ref 5–15)
BUN: 27 mg/dL — ABNORMAL HIGH (ref 8–23)
CO2: 20 mmol/L — ABNORMAL LOW (ref 22–32)
Calcium: 9 mg/dL (ref 8.9–10.3)
Chloride: 108 mmol/L (ref 98–111)
Creatinine, Ser: 0.87 mg/dL (ref 0.44–1.00)
GFR calc Af Amer: >60 mL/min (ref >60)
GFR calc non Af Amer: >60 mL/min (ref >60)
Glucose, Bld: 105 mg/dL — ABNORMAL HIGH (ref 70–99)
Potassium: 3.2 mmol/L — ABNORMAL LOW (ref 3.5–5.1)
Sodium: 139 mmol/L (ref 135–145)
Total Bilirubin: 0.9 mg/dL (ref 0.3–1.2)
Total Protein: 7.3 g/dL (ref 6.5–8.1)

## 2019-02-18 LAB — LACTATE DEHYDROGENASE: LDH: 161 U/L (ref 98–192)

## 2019-02-18 MED ORDER — SODIUM CHLORIDE 0.9% FLUSH
10.0000 mL | Freq: Once | INTRAVENOUS | Status: AC
  Administered 2019-02-18: 16:00:00 10 mL via INTRAVENOUS

## 2019-02-18 MED ORDER — HEPARIN SOD (PORK) LOCK FLUSH 100 UNIT/ML IV SOLN
500.0000 [IU] | Freq: Once | INTRAVENOUS | Status: AC
  Administered 2019-02-18: 500 [IU] via INTRAVENOUS

## 2019-02-18 NOTE — Progress Notes (Signed)
Brittany Rowland presented for Portacath access and flush. Portacath located right chest wall accessed with  H 20 needle. Good blood return present. Portacath flushed with 25ml NS and 500U/51ml Heparin and needle removed intact. Procedure without incident. Patient tolerated procedure well. Labs done per orders.    Vitals stable and discharged home from clinic ambulatory. Follow up as scheduled.

## 2019-02-25 ENCOUNTER — Inpatient Hospital Stay (HOSPITAL_COMMUNITY): Payer: BC Managed Care – PPO | Attending: Nurse Practitioner | Admitting: Hematology

## 2019-02-25 ENCOUNTER — Encounter (HOSPITAL_COMMUNITY): Payer: Self-pay | Admitting: Hematology

## 2019-02-25 ENCOUNTER — Other Ambulatory Visit: Payer: Self-pay

## 2019-02-25 VITALS — BP 122/73 | HR 88 | Temp 97.9°F | Resp 18 | Wt 168.4 lb

## 2019-02-25 DIAGNOSIS — I10 Essential (primary) hypertension: Secondary | ICD-10-CM | POA: Insufficient documentation

## 2019-02-25 DIAGNOSIS — F329 Major depressive disorder, single episode, unspecified: Secondary | ICD-10-CM | POA: Diagnosis not present

## 2019-02-25 DIAGNOSIS — Z87891 Personal history of nicotine dependence: Secondary | ICD-10-CM | POA: Diagnosis not present

## 2019-02-25 DIAGNOSIS — E785 Hyperlipidemia, unspecified: Secondary | ICD-10-CM | POA: Insufficient documentation

## 2019-02-25 DIAGNOSIS — C9110 Chronic lymphocytic leukemia of B-cell type not having achieved remission: Secondary | ICD-10-CM

## 2019-02-25 DIAGNOSIS — Z9225 Personal history of immunosupression therapy: Secondary | ICD-10-CM | POA: Diagnosis not present

## 2019-02-25 DIAGNOSIS — R197 Diarrhea, unspecified: Secondary | ICD-10-CM | POA: Insufficient documentation

## 2019-02-25 DIAGNOSIS — R911 Solitary pulmonary nodule: Secondary | ICD-10-CM | POA: Insufficient documentation

## 2019-02-25 DIAGNOSIS — E119 Type 2 diabetes mellitus without complications: Secondary | ICD-10-CM | POA: Diagnosis not present

## 2019-02-25 DIAGNOSIS — Z79899 Other long term (current) drug therapy: Secondary | ICD-10-CM | POA: Diagnosis not present

## 2019-02-25 DIAGNOSIS — Z9221 Personal history of antineoplastic chemotherapy: Secondary | ICD-10-CM | POA: Diagnosis not present

## 2019-02-25 DIAGNOSIS — G479 Sleep disorder, unspecified: Secondary | ICD-10-CM | POA: Insufficient documentation

## 2019-02-25 DIAGNOSIS — E876 Hypokalemia: Secondary | ICD-10-CM | POA: Diagnosis not present

## 2019-02-25 NOTE — Progress Notes (Signed)
Brittany Rowland, Brittany Rowland [address]   CLINIC:  Medical Oncology/Hematology  PCP:  Brittany Rowland [facility] Brittany Rowland [phone]   REASON FOR VISIT:  Follow-up for CLL.  CURRENT THERAPY: Ibrutinib 420 mg daily.  BRIEF ONCOLOGIC HISTORY:  Oncology History  CLL (chronic lymphocytic leukemia) ([place])  12/16/2011 Initial Diagnosis   CLL (chronic lymphocytic leukemia), stage A (Binet), stage 0 (Rai)   01/13/2013 Pathology Results   Monoclonal B cell population is detected with kapp light chain restriction, representing 82% of peripheral leukocytes. B cell population expresses CD19, CD20, CD11C, CD23, and aberrant CD5. No blasts.   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     03/30/2014 Cancer Staging   CLL Stage B (Binet), Stage I (Rai), now with doubling of WBC and approximate 6 months and widespread adenopathy, dropping hemoglobin, but not under 11 g/dL yet, fatigue and weakness   05/31/2014 Cancer Staging   Stage III by both Binet and Rai staging systems, HGB 10.6 g/dL.   07/07/2014 Imaging   CT CAP- extensive adenopathy w/in low neck, chest, abdomen, and pelvis.  Centrilobular emphysema. Nonspecific 5 mm LLL pulmonary nodule. Nonspecific focal left-sided pleural nodularity. 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   08/02/2014 - 11/02/2014 Chemotherapy   Bendamustine/Rituxan x 4 cycles with normalization of HGB and WBC and PLT count with disappearance of all symptoms and lymph nodes   09/07/2014 Adverse Reaction   Hospitalization with fever and chills, acute bronchitis, negative blood cultures. Comlicated by Levaquin skin rash.  Bendamustine dose reduced for next cycle.   09/08/2014 Echocardiogram   Left ventricular wall motion and contractility are within normal limits.  The estimated ejection fraction is greater than  65%.  Normal left ventricular diastolic filling is observed.   12/21/2014 Treatment Plan Change   Chemotherapy discontinued following 4 cycles of therapy due to tremendouns response and disappearance of all symptoms and lymph nodes.   12/21/2014 Remission     06/22/2015 Relapse/Recurrence   Per Dr. [name]: "Now relapsing changes in her WBC count, lymphocyte count, LDH level. Before she gets symptomatic again, I think is worthy of starting her on Ibrutinib while she has a great performance status..."   06/29/2015 -  Chemotherapy   Ibrutinib initiated      CANCER STAGING: Cancer Staging No matching staging information was found for the patient.   INTERVAL HISTORY:  [name] 69 y.o. female seen for follow-up of CLL.  She stopped taking ibrutinib about 3 to 4 weeks ago as she did not get her shipment.  She did not follow through with her specialty pharmacy.  Appetite and energy levels are 75%.  Occasional diarrhea still present.  She is feeling slightly depressed.    REVIEW OF SYSTEMS:  Review of Systems  Gastrointestinal: Positive for diarrhea.  Psychiatric/Behavioral: Positive for depression and sleep disturbance.  All other systems reviewed and are negative.    PAST MEDICAL/SURGICAL HISTORY:  Past Medical History:  Diagnosis Date  . Alcohol dependence, continuous ([place]) 05/15/2004   Overview:  ICD10 Conversion  . Anemia   . 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 CLL (chronic lymphocytic leukemia) ([place])   . Depression   . Diabetes mellitus without complication ([place])   . Hypertension   . Hypokalemia   . Leukemia ([place])   . Tobacco abuse 12/21/2011   Quit in July 2016  Past Surgical History:  Procedure Laterality Date  . ABDOMINAL HYSTERECTOMY    . PORT A CATH INJECTION ([facility] HX)       SOCIAL HISTORY:  Social History   Socioeconomic History  . Marital status: Married    Spouse name: Not on file  . Number of children: Not on file  . Years of education:  Not on file  . Highest education level: Not on file  Occupational History  . Not on file  Tobacco Use  . Smoking status: Former Smoker    Packs/day: 1.00    Years: 45.00    Pack years: 45.00    Types: Cigarettes    Quit date: 09/15/2014    Years since quitting: 4.4  . Smokeless tobacco: Former Network engineer and Sexual Activity  . Alcohol use: Yes    Alcohol/week: 2.0 standard drinks    Types: 2 Shots of liquor per week  . Drug use: No  . Sexual activity: Not on file  Other Topics Concern  . Not on file  Social History Narrative  . Not on file   Social Determinants of Health   Financial Resource Strain:   . Difficulty of Paying Living Expenses: Not on file  Food Insecurity:   . Worried About Charity fundraiser in the Last Year: Not on file  . Ran Out of Food in the Last Year: Not on file  Transportation Needs:   . Lack of Transportation (Medical): Not on file  . Lack of Transportation (Non-Medical): Not on file  Physical Activity:   . Days of Exercise per Week: Not on file  . Minutes of Exercise per Session: Not on file  Stress:   . Feeling of Stress : Not on file  Social Connections:   . Frequency of Communication with Friends and Family: Not on file  . Frequency of Social Gatherings with Friends and Family: Not on file  . Attends Religious Services: Not on file  . Active Member of Clubs or Organizations: Not on file  . Attends Archivist Meetings: Not on file  . Marital Status: Not on file  Intimate Partner Violence:   . Fear of Current or Ex-Partner: Not on file  . Emotionally Abused: Not on file  . Physically Abused: Not on file  . Sexually Abused: Not on file    FAMILY HISTORY:  Family History  Problem Relation Age of Onset  . Heart failure Mother   . Diabetes Mother   . Hypertension Mother   . Heart failure Father   . Colon cancer Neg Hx     CURRENT MEDICATIONS:  Outpatient Encounter Medications as of 02/25/2019  Medication Sig Note  .  atorvastatin (LIPITOR) 20 MG tablet Take 20 mg by mouth daily at 6 PM.    . IMBRUVICA 420 MG TABS TAKE ONE TABLET BY MOUTH ONCE DAILY (Patient not taking: Reported on 02/25/2019)   . lidocaine-prilocaine (EMLA) cream Apply 1 application topically as needed.   Marland Kitchen lisinopril-hydrochlorothiazide (PRINZIDE,ZESTORETIC) 20-12.5 MG tablet Take 1 tablet by mouth daily.    . Multiple Vitamins-Minerals (THERA-M) TABS Take by mouth. 09/15/2015: Received from: [place]: Take 1 tablet by mouth daily.  . potassium chloride SA (K-DUR,KLOR-CON) 20 MEQ tablet Take 2 tablets (40 mEq total) by mouth 2 (two) times daily.    Facility-Administered Encounter Medications as of 02/25/2019  Medication  . sodium chloride flush (NS) 0.9 % injection 10 mL    ALLERGIES:  Allergies  Allergen Reactions  .  Aspirin Nausea And Vomiting  . Levaquin  [Levofloxacin In D5w] Rash     PHYSICAL EXAM:  ECOG Performance status: 1  Vitals:   02/25/19 1558  BP: 122/73  Pulse: 88  Resp: 18  Temp: 97.9 F (36.6 C)  SpO2: 100%   Filed Weights   02/25/19 1558  Weight: 168 lb 6.4 oz (76.4 kg)    Physical Exam Vitals reviewed.  Constitutional:      Appearance: Normal appearance.  Cardiovascular:     Rate and Rhythm: Normal rate and regular rhythm.     Heart sounds: Normal heart sounds.  Pulmonary:     Effort: Pulmonary effort is normal.     Breath sounds: Normal breath sounds.  Abdominal:     General: There is no distension.     Palpations: Abdomen is soft. There is no mass.  Musculoskeletal:        General: No swelling.  Lymphadenopathy:     Cervical: No cervical adenopathy.  Skin:    General: Skin is warm.  Neurological:     General: No focal deficit present.     Mental Status: She is alert and oriented to person, place, and time.  Psychiatric:        Mood and Affect: Mood normal.        Behavior: Behavior normal.      LABORATORY DATA:  I have reviewed the labs as listed.  CBC      Component Value Date/Time   WBC 6.6 02/18/2019 1538   RBC 4.70 02/18/2019 1538   HGB 12.7 02/18/2019 1538   HCT 40.0 02/18/2019 1538   PLT 324 02/18/2019 1538   MCV 85.1 02/18/2019 1538   MCH 27.0 02/18/2019 1538   MCHC 31.8 02/18/2019 1538   RDW 14.4 02/18/2019 1538   LYMPHSABS 3.3 02/18/2019 1538   MONOABS 0.5 02/18/2019 1538   EOSABS 1.1 (H) 02/18/2019 1538   BASOSABS 0.1 02/18/2019 1538   CMP Latest Ref Rng & Units 02/18/2019 10/08/2018 03/19/2018  Glucose 70 - 99 mg/dL 105(H) 93 117(H)  BUN 8 - 23 mg/dL 27(H) 24(H) 26(H)  Creatinine 0.44 - 1.00 mg/dL 0.87 1.07(H) 1.01(H)  Sodium 135 - 145 mmol/L 139 140 136  Potassium 3.5 - 5.1 mmol/L 3.2(L) 3.7 3.2(L)  Chloride 98 - 111 mmol/L 108 107 103  CO2 22 - 32 mmol/L 20(L) 24 23  Calcium 8.9 - 10.3 mg/dL 9.0 9.1 9.4  Total Protein 6.5 - 8.1 g/dL 7.3 7.1 7.3  Total Bilirubin 0.3 - 1.2 mg/dL 0.9 0.8 0.3  Alkaline Phos 38 - 126 U/L 50 45 40  AST 15 - 41 U/L 19 18 19   ALT 0 - 44 U/L 19 17 17        DIAGNOSTIC IMAGING:  I have independently reviewed the scans and discussed with the patient.    ASSESSMENT & PLAN:   CLL (chronic lymphocytic leukemia) ([place]) 1.  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 - Ibrutinib 420 mg daily started in May 2017. -She reportedly stopped taking the ibrutinib about 3 to 4 weeks ago.  Part of the reason is the drug company did not call her.  Other parties she was feeling depressed because of the Covid and she lost her best friend who works with her in [place]. -She does not report any fevers, night sweats or weight loss.  Physical examination did not reveal any palpable adenopathy or splenomegaly. -Reviewed her blood counts which are grossly unchanged. -We  will make sure to contact her pharmacy and get her back on ibrutinib. -I will see her back in 2 months for follow-up.  2.  Left lower lobe lung nodule: -CT scan of the chest on 10/08/2018  shows 5 mm left lower lobe lung nodule which is stable, most likely benign.  3.  Mild hypokalemia: -Potassium is 3.2 today. -She is already on potassium 40 mEq twice daily.    Orders placed this encounter:  Orders Placed This Encounter  Procedures  . CBC with Differential/Platelet  . Comprehensive metabolic panel  . Lactate dehydrogenase      Derek Jack, MD [place] [phone]

## 2019-02-25 NOTE — Patient Instructions (Addendum)
[place] at [facility] Discharge Instructions  You were seen today by Dr. Delton Coombes. He went over your recent lab results. Please restart you ibrutinib as soon as you receive it. He will see you back in 2 months for labs and follow up.   Thank you for choosing [place] at [facility] to provide your oncology and hematology care.  To afford each patient quality time with our provider, please arrive at least 15 minutes before your scheduled appointment time.   If you have a lab appointment with the [place] please come in thru the  Main Entrance and check in at the main information desk  You need to re-schedule your appointment should you arrive 10 or more minutes late.  We strive to give you quality time with our providers, and arriving late affects you and other patients whose appointments are after yours.  Also, if you no show three or more times for appointments you may be dismissed from the clinic at the providers discretion.     Again, thank you for choosing [facility].  Our hope is that these requests will decrease the amount of time that you wait before being seen by our physicians.       _____________________________________________________________  Should you have questions after your visit to [facility], please contact our office at [phone] between the hours of 8:00 a.m. and 4:30 p.m.  Voicemails left after 4:00 p.m. will not be returned until the following business day.  For prescription refill requests, have your pharmacy contact our office and allow 72 hours.    Cancer Center Support Programs:   > Cancer Support Group  2nd Tuesday of the month 1pm-2pm, Journey Room  '

## 2019-02-25 NOTE — Assessment & Plan Note (Signed)
1.  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 - Ibrutinib 420 mg daily started in May 2017. -She reportedly stopped taking the ibrutinib about 3 to 4 weeks ago.  Part of the reason is the drug company did not call her.  Other parties she was feeling depressed because of the Covid and she lost her best friend who works with her in [place]. -She does not report any fevers, night sweats or weight loss.  Physical examination did not reveal any palpable adenopathy or splenomegaly. -Reviewed her blood counts which are grossly unchanged. -We will make sure to contact her pharmacy and get her back on ibrutinib. -I will see her back in 2 months for follow-up.  2.  Left lower lobe lung nodule: -CT scan of the chest on 10/08/2018 shows 5 mm left lower lobe lung nodule which is stable, most likely benign.  3.  Mild hypokalemia: -Potassium is 3.2 today. -She is already on potassium 40 mEq twice daily.

## 2019-02-26 ENCOUNTER — Other Ambulatory Visit (HOSPITAL_COMMUNITY): Payer: Self-pay | Admitting: *Deleted

## 2019-02-26 MED ORDER — IMBRUVICA 420 MG PO TABS: 1.0000 | ORAL_TABLET | Freq: Every day | ORAL | 0 refills | Status: DC

## 2019-02-26 NOTE — Telephone Encounter (Signed)
I received a message from Dr. Delton Coombes that patient needs refill on her ibrutinib and has been out of it for a while.   I called and spoke with Ms. Sharpley via telephone today.  I advised her that she has to take this medication every day in order for it to be effective in controlling her cancer.  I explained the importance of taking it and not missing any doses.  She advises that she had given up on her health after the loss of a friend about a month ago.  She said that her husband is pushing her to keep fighting.  She verbalized the intent of restarting the ibrutinib and wants to fight for her life.  I advised her to answer the phone when the pharmacy calls to set up shipment.  She verbalizes understanding.

## 2019-04-02 ENCOUNTER — Other Ambulatory Visit (HOSPITAL_COMMUNITY): Payer: Self-pay | Admitting: Hematology

## 2019-04-07 IMAGING — CT CT CHEST W/ CM
2 of 5 series · 12 of 36 positions shown, 15 images · IV contrast (iopamidol)
Comparison: None.

CLINICAL DATA: Patient with history of leukemia diagnosed in 4055
status post chemotherapy.

EXAM:
CT CHEST, ABDOMEN, AND PELVIS WITH CONTRAST
TECHNIQUE: Multidetector CT imaging of the chest, abdomen and pelvis was
performed following the standard protocol during bolus
administration of intravenous contrast.
CONTRAST:  100mL 9QCCVR-[id number] IOPAMIDOL ([id number]) INJECTION 61%

[Series 2: cap with · axial · 0.65mm/px · z∈[+1012,+1537]mm · 9 of 133 slices shown, 12 images]
[im 14/133  mediastinal]
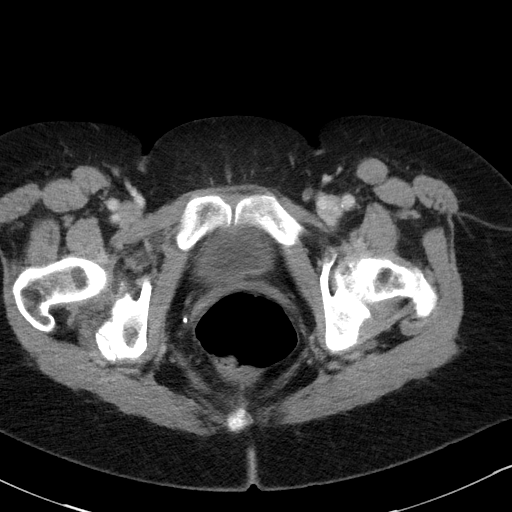
[im 14/133  lung]
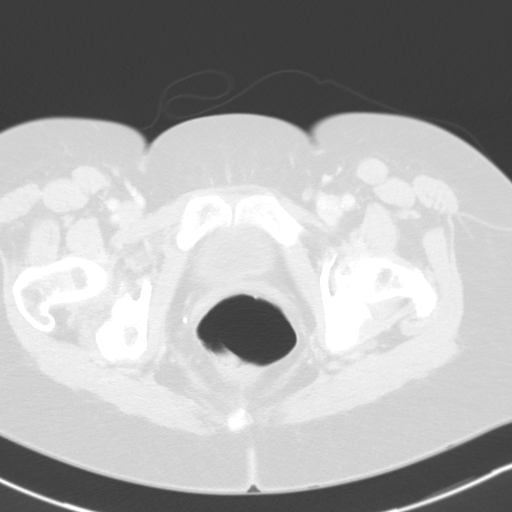
[im 27/133  lung]
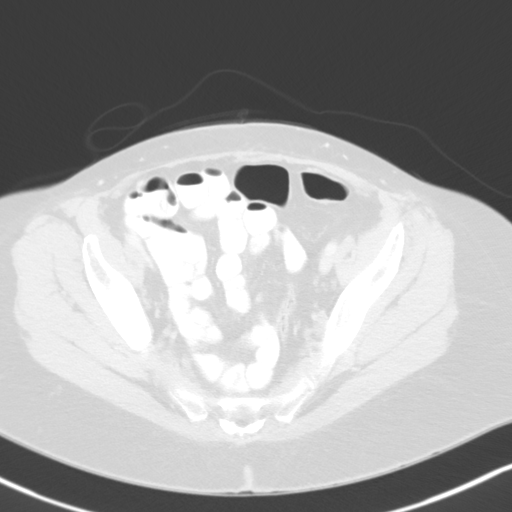
[im 40/133  lung]
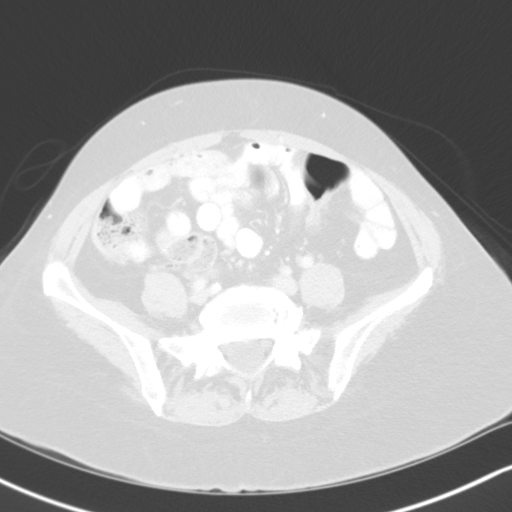
[im 53/133  lung]
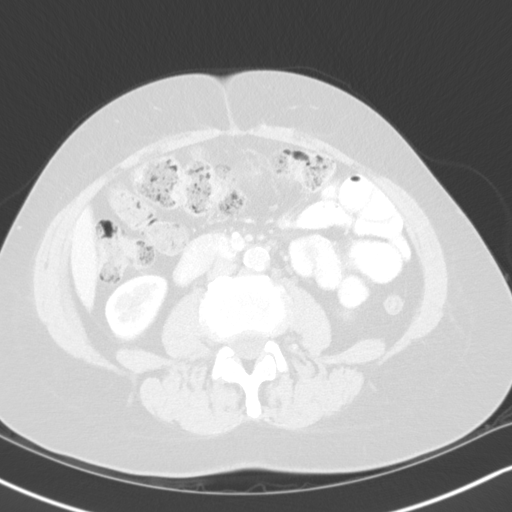
[im 67/133  mediastinal]
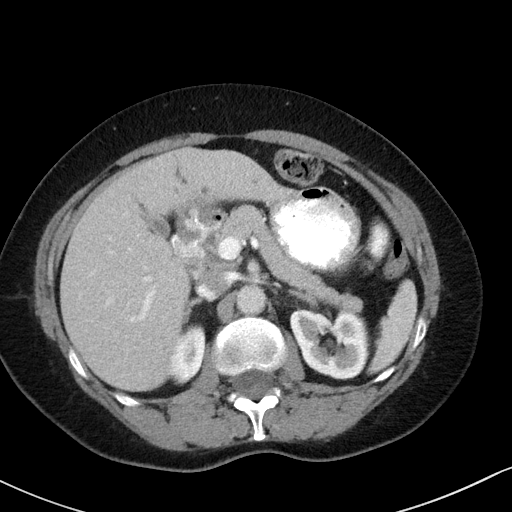
[im 67/133  lung]
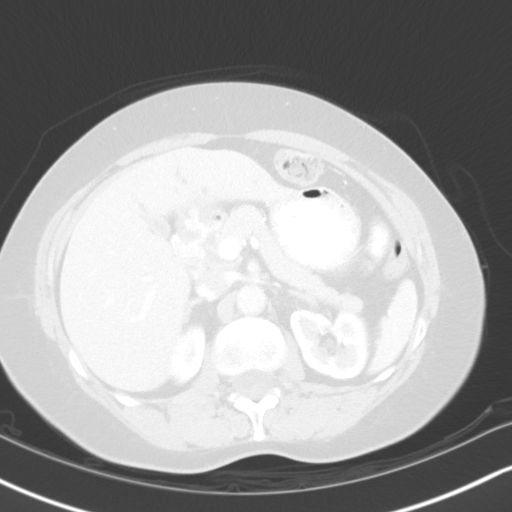
[im 80/133  lung]
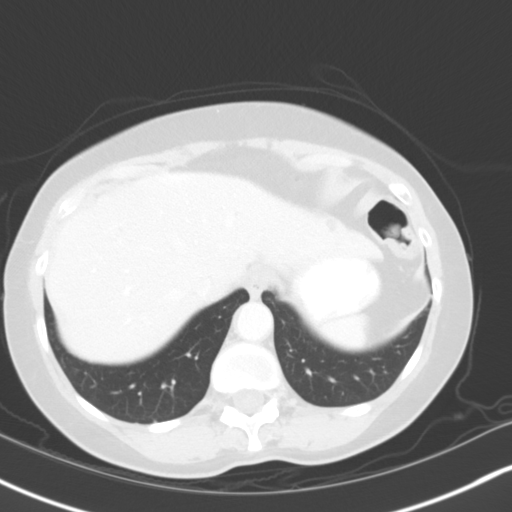
[im 93/133  lung]
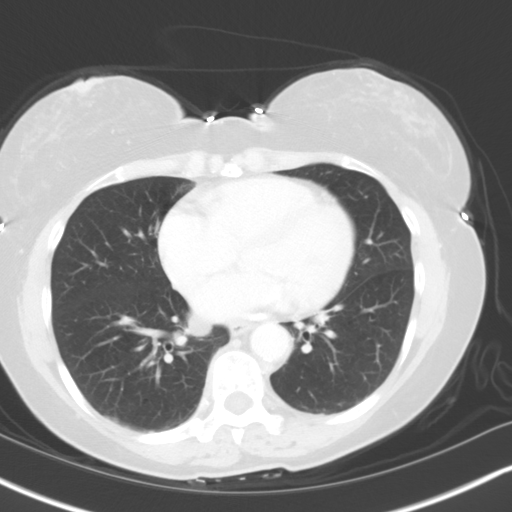
[im 106/133  lung]
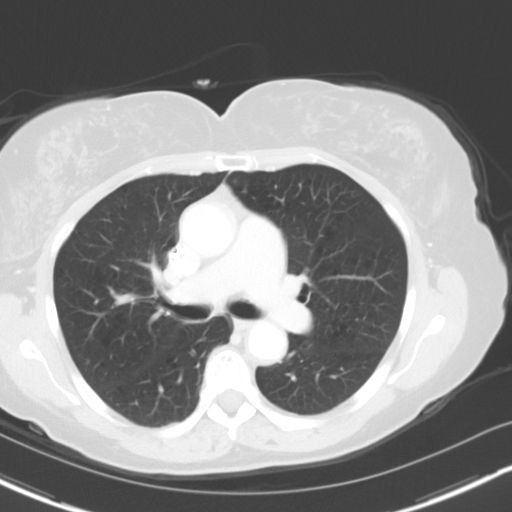
[im 119/133  mediastinal]
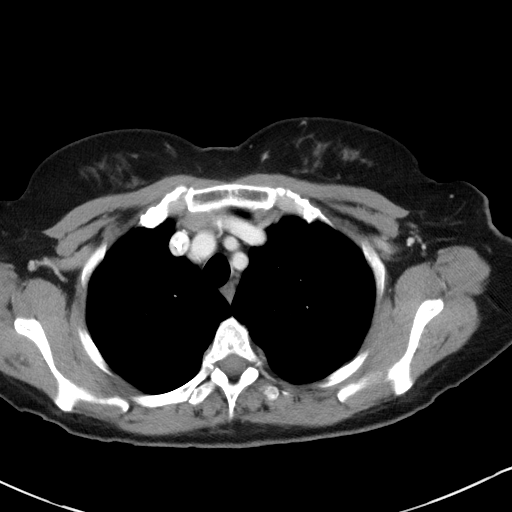
[im 119/133  lung]
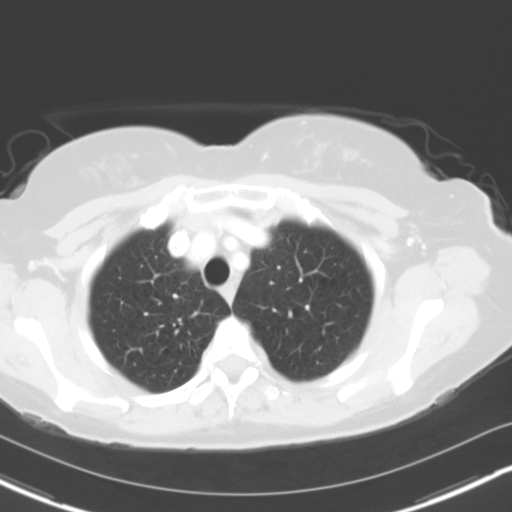

[Series 5: coronals · coronal · 0.65mm/px · 3 of 137 slices shown]
[im 28/137  lung]
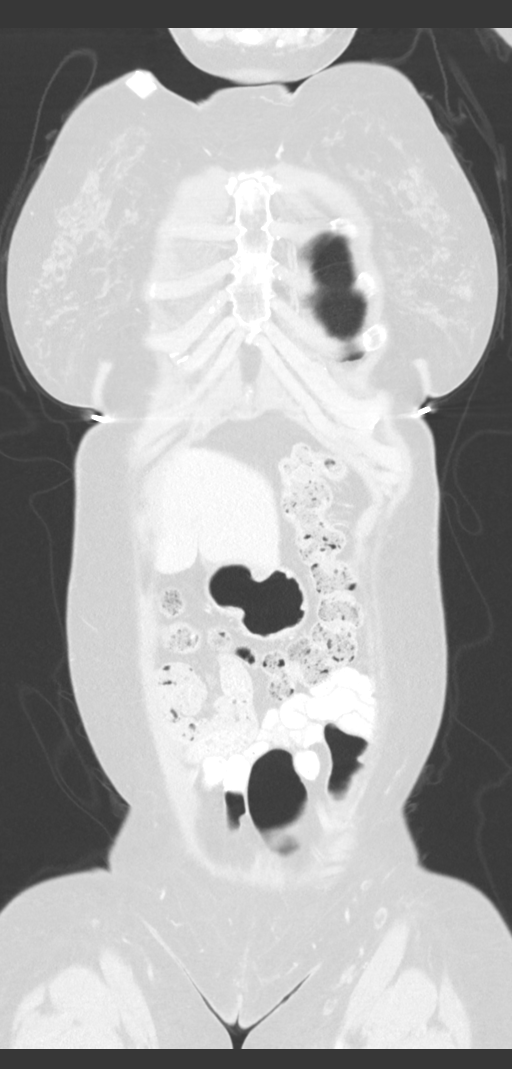
[im 55/137  lung]
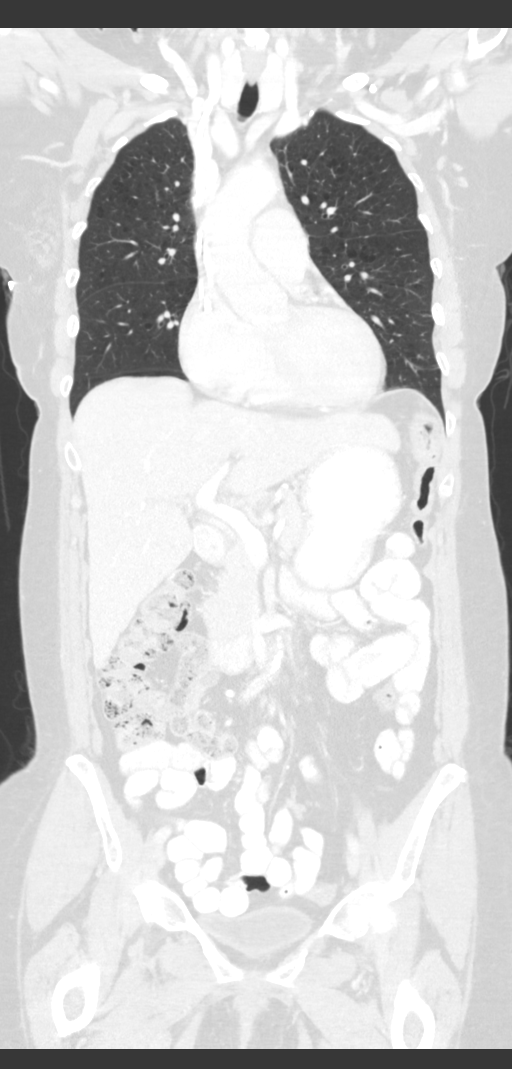
[im 82/137  lung]
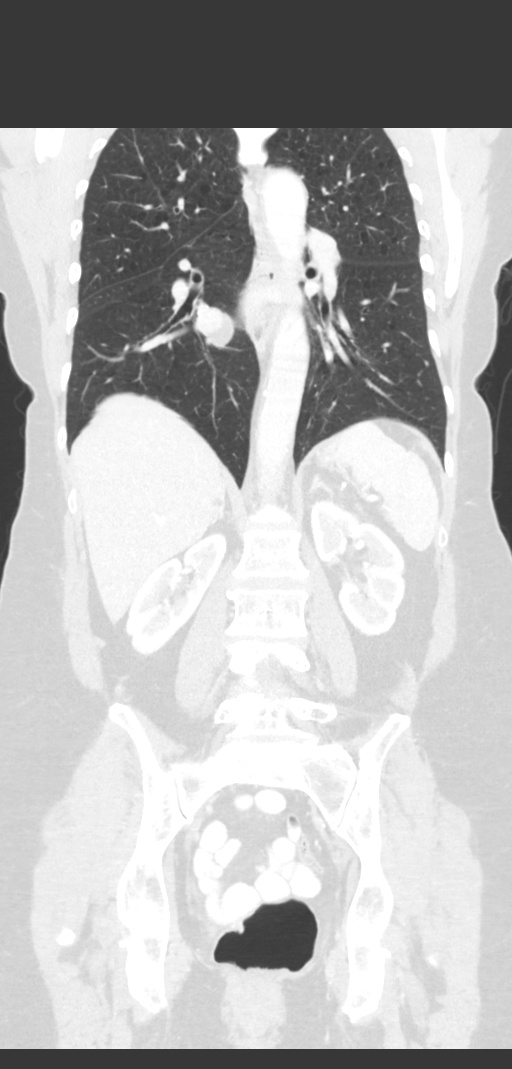

[12 of 36 positions shown; findings below may reference images not displayed]

FINDINGS: CT CHEST FINDINGS

Cardiovascular: Right anterior chest wall Port-A-Cath is present
with tip terminating in the superior vena cava. Heart is mildly
enlarged. Coronary arterial vascular calcifications. Thoracic aortic
vascular calcifications. Trace fluid superior pericardial recess.

Mediastinum/Nodes: No enlarged axillary, mediastinal or hilar
lymphadenopathy. Normal appearance of the esophagus.

Lungs/Pleura: Dependent atelectasis left lower lobe. 5 mm left lower
lobe nodule (image 110; series 4). Centrilobular and paraseptal
emphysematous change. No pleural effusion or pneumothorax.

Musculoskeletal: No aggressive or acute appearing osseous lesions.
Within the lateral aspect of the left breast there is a 1.9 x 0.9 cm
mass.

CT ABDOMEN PELVIS FINDINGS

Hepatobiliary: Liver is normal in size and contour. Multiple
low-attenuation lesions are demonstrated within the liver measuring
up to 9 mm in the left hepatic lobe and 13 mm in the right hepatic
lobe (image 65; series 2). Multiple additional subcentimeter too
small to characterize low-attenuation lesions in the liver.
Gallbladder is decompressed. No intrahepatic or extrahepatic biliary
ductal dilatation.

Pancreas: Unremarkable

Spleen: Unremarkable

Adrenals/Urinary Tract: Normal adrenal glands. Kidneys enhance
symmetrically with contrast. No hydronephrosis. Urinary bladder is
unremarkable.

Stomach/Bowel: Normal morphology of the stomach. No evidence for
small bowel obstruction. No free fluid or free intraperitoneal air.

Vascular/Lymphatic: Normal caliber abdominal aorta. No
retroperitoneal lymphadenopathy. Peripheral calcified
atherosclerotic plaque involving the abdominal aorta.

Reproductive: Status post hysterectomy.

Other: None.

Musculoskeletal: Lumbar spine degenerative changes. No aggressive or
acute appearing osseous lesions.
IMPRESSION: 1. No acute process within the chest, abdomen or pelvis.
2. Pulmonary nodule measuring up to 5 mm. No follow-up needed if
patient is low-risk. Non-contrast chest CT can be considered in 12
months if patient is high-risk. This recommendation follows the
consensus statement: Guidelines for Management of Incidental
Pulmonary Nodules Detected on CT Images: From the [HOSPITAL]
3. Indeterminate mass within the lateral aspect of the left breast.
Recommend correlation with recent mammography.
4. Multiple low-attenuation lesions in the liver, favored to
represent cysts however too small to characterize.

## 2019-04-29 ENCOUNTER — Inpatient Hospital Stay (HOSPITAL_COMMUNITY): Payer: BC Managed Care – PPO | Attending: Nurse Practitioner

## 2019-04-29 ENCOUNTER — Other Ambulatory Visit: Payer: Self-pay

## 2019-04-29 ENCOUNTER — Encounter (HOSPITAL_COMMUNITY): Payer: Self-pay

## 2019-04-29 DIAGNOSIS — E119 Type 2 diabetes mellitus without complications: Secondary | ICD-10-CM | POA: Insufficient documentation

## 2019-04-29 DIAGNOSIS — I10 Essential (primary) hypertension: Secondary | ICD-10-CM | POA: Insufficient documentation

## 2019-04-29 DIAGNOSIS — Z87891 Personal history of nicotine dependence: Secondary | ICD-10-CM | POA: Insufficient documentation

## 2019-04-29 DIAGNOSIS — R197 Diarrhea, unspecified: Secondary | ICD-10-CM | POA: Insufficient documentation

## 2019-04-29 DIAGNOSIS — Z9221 Personal history of antineoplastic chemotherapy: Secondary | ICD-10-CM | POA: Diagnosis not present

## 2019-04-29 DIAGNOSIS — R911 Solitary pulmonary nodule: Secondary | ICD-10-CM | POA: Insufficient documentation

## 2019-04-29 DIAGNOSIS — C9110 Chronic lymphocytic leukemia of B-cell type not having achieved remission: Secondary | ICD-10-CM

## 2019-04-29 DIAGNOSIS — Z79899 Other long term (current) drug therapy: Secondary | ICD-10-CM | POA: Diagnosis not present

## 2019-04-29 DIAGNOSIS — Z452 Encounter for adjustment and management of vascular access device: Secondary | ICD-10-CM | POA: Insufficient documentation

## 2019-04-29 DIAGNOSIS — E876 Hypokalemia: Secondary | ICD-10-CM | POA: Diagnosis not present

## 2019-04-29 LAB — COMPREHENSIVE METABOLIC PANEL
ALT: 21 U/L (ref 0–44)
AST: 19 U/L (ref 15–41)
Albumin: 3.7 g/dL (ref 3.5–5.0)
Alkaline Phosphatase: 60 U/L (ref 38–126)
Anion gap: 8 (ref 5–15)
BUN: 30 mg/dL — ABNORMAL HIGH (ref 8–23)
CO2: 23 mmol/L (ref 22–32)
Calcium: 8.8 mg/dL — ABNORMAL LOW (ref 8.9–10.3)
Chloride: 109 mmol/L (ref 98–111)
Creatinine, Ser: 0.9 mg/dL (ref 0.44–1.00)
GFR calc Af Amer: >60 mL/min (ref >60)
GFR calc non Af Amer: >60 mL/min (ref >60)
Glucose, Bld: 100 mg/dL — ABNORMAL HIGH (ref 70–99)
Potassium: 3.4 mmol/L — ABNORMAL LOW (ref 3.5–5.1)
Sodium: 140 mmol/L (ref 135–145)
Total Bilirubin: 0.5 mg/dL (ref 0.3–1.2)
Total Protein: 6.8 g/dL (ref 6.5–8.1)

## 2019-04-29 LAB — LACTATE DEHYDROGENASE: LDH: 154 U/L (ref 98–192)

## 2019-04-29 LAB — CBC WITH DIFFERENTIAL/PLATELET
Abs Immature Granulocytes: 0.05 10*3/uL (ref 0.00–0.07)
Basophils Absolute: 0.1 10*3/uL (ref 0.0–0.1)
Basophils Relative: 1 %
Eosinophils Absolute: 0.3 10*3/uL (ref 0.0–0.5)
Eosinophils Relative: 4 %
HCT: 39.7 % (ref 36.0–46.0)
Hemoglobin: 12.5 g/dL (ref 12.0–15.0)
Immature Granulocytes: 1 %
Lymphocytes Relative: 49 %
Lymphs Abs: 4.1 10*3/uL — ABNORMAL HIGH (ref 0.7–4.0)
MCH: 27.4 pg (ref 26.0–34.0)
MCHC: 31.5 g/dL (ref 30.0–36.0)
MCV: 86.9 fL (ref 80.0–100.0)
Monocytes Absolute: 0.6 10*3/uL (ref 0.1–1.0)
Monocytes Relative: 7 %
Neutro Abs: 3.1 10*3/uL (ref 1.7–7.7)
Neutrophils Relative %: 38 %
Platelets: 262 10*3/uL (ref 150–400)
RBC: 4.57 MIL/uL (ref 3.87–5.11)
RDW: 15.3 % (ref 11.5–15.5)
WBC: 8.3 10*3/uL (ref 4.0–10.5)
nRBC: 0 % (ref 0.0–0.2)

## 2019-04-29 MED ORDER — SODIUM CHLORIDE 0.9% FLUSH
20.0000 mL | INTRAVENOUS | Status: DC | PRN
  Administered 2019-04-29: 20 mL via INTRAVENOUS

## 2019-04-29 MED ORDER — HEPARIN SOD (PORK) LOCK FLUSH 100 UNIT/ML IV SOLN
500.0000 [IU] | Freq: Once | INTRAVENOUS | Status: AC
  Administered 2019-04-29: 500 [IU] via INTRAVENOUS

## 2019-04-29 NOTE — Progress Notes (Signed)
Brittany Rowland tolerated port lab draw with flush well without complaints or incident.Port accessed with 20 gauge needle with blood drawn for labs ordered then flushed easily per protocol and de--accessed. VSS Pt discharged self ambulatory in satisfactory condition

## 2019-04-29 NOTE — Patient Instructions (Signed)
[place] at [facility] Discharge Instructions  Labs drawn from portacath today then flushed per protocol. Follow-up as scheduled. Call clinic for any questions or concerns   Thank you for choosing [place] at [facility] to provide your oncology and hematology care.  To afford each patient quality time with our provider, please arrive at least 15 minutes before your scheduled appointment time.   If you have a lab appointment with the [facility] please come in thru the Main Entrance and check in at the main information desk.  You need to re-schedule your appointment should you arrive 10 or more minutes late.  We strive to give you quality time with our providers, and arriving late affects you and other patients whose appointments are after yours.  Also, if you no show three or more times for appointments you may be dismissed from the clinic at the providers discretion.     Again, thank you for choosing [facility].  Our hope is that these requests will decrease the amount of time that you wait before being seen by our physicians.       _____________________________________________________________  Should you have questions after your visit to [facility], please contact our office at [phone] between the hours of 8:00 a.m. and 4:30 p.m.  Voicemails left after 4:00 p.m. will not be returned until the following business day.  For prescription refill requests, have your pharmacy contact our office and allow 72 hours.    Due to Covid, you will need to wear a mask upon entering the hospital. If you do not have a mask, a mask will be given to you at the Main Entrance upon arrival. For doctor visits, patients may have 1 support person with them. For treatment visits, patients can not have anyone with them due to social distancing guidelines and our immunocompromised population.

## 2019-05-06 ENCOUNTER — Encounter (HOSPITAL_COMMUNITY): Payer: Self-pay | Admitting: Hematology

## 2019-05-06 ENCOUNTER — Inpatient Hospital Stay (HOSPITAL_BASED_OUTPATIENT_CLINIC_OR_DEPARTMENT_OTHER): Payer: BC Managed Care – PPO | Admitting: Hematology

## 2019-05-06 ENCOUNTER — Other Ambulatory Visit: Payer: Self-pay

## 2019-05-06 VITALS — BP 123/77 | HR 72 | Temp 96.9°F | Resp 18 | Wt 168.3 lb

## 2019-05-06 DIAGNOSIS — C9110 Chronic lymphocytic leukemia of B-cell type not having achieved remission: Secondary | ICD-10-CM | POA: Diagnosis not present

## 2019-05-06 NOTE — Progress Notes (Signed)
Brittany Rowland, [place] [address]   CLINIC:  Medical Oncology/Hematology  PCP:  Leory [facility] [place] [address] [phone]   REASON FOR VISIT:  Follow-up for CLL.  CURRENT THERAPY: Ibrutinib 420 mg daily.  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 B cell population expresses CD19, CD20, CD11C, CD23, and aberrant CD5. No blasts.   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     03/30/2014 Cancer Staging   CLL Stage B ([place]), Stage I ([place]), now with doubling of WBC and approximate 6 months and widespread adenopathy, dropping hemoglobin, but not under 11 g/dL yet, fatigue and weakness   05/31/2014 Cancer Staging   Stage III by both [place] and [place]ai staging systems, HGB 10.6 g/dL.   07/07/2014 Imaging   CT CAP- extensive adenopathy w/in low neck, chest, abdomen, and pelvis.  Centrilobular emphysema. Nonspecific 5 mm LLL pulmonary nodule. Nonspecific focal left-sided pleural nodularity. 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   08/02/2014 - 11/02/2014 Chemotherapy   Bendamustine/Rituxan x 4 cycles with normalization of HGB and WBC and PLT count with disappearance of all symptoms and lymph nodes   09/07/2014 Adverse Reaction   Hospitalization with fever and chills, acute bronchitis, negative blood cultures. Comlicated by Levaquin skin rash.  Bendamustine dose reduced for next cycle.   09/08/2014 Echocardiogram   Left ventricular wall motion and contractility are within normal limits.  The estimated ejection fraction is greater than  65%.  Normal left ventricular diastolic filling is observed.   12/21/2014 Treatment Plan Change   Chemotherapy discontinued following 4 cycles of therapy due to tremendouns response and disappearance of all symptoms and lymph nodes.   12/21/2014 Remission     06/22/2015 Relapse/Recurrence   Per Dr. [name]: "Now relapsing changes in her WBC count, lymphocyte count, LDH level. Before she gets symptomatic again, I think is worthy of starting her on Ibrutinib while she has a great performance status..."   06/29/2015 -  Chemotherapy   Ibrutinib initiated      CANCER STAGING: Cancer Staging No matching staging information was found for the patient.   INTERVAL HISTORY:  [name] 69 y.o. female seen for follow-up of CLL.  She reports appetite and energy levels of 100%.  She works third shift and is feeling sleepy today.  Denies any nosebleeds, hematuria or bleeding per rectum.  However she reports easy bruising on upper extremities when she bumps into things.  Denies any fevers, night sweats or weight loss in the last 6 months.  No recurrent infections.    REVIEW OF SYSTEMS:  Review of Systems  Gastrointestinal: Positive for diarrhea.  All other systems reviewed and are negative.    PAST MEDICAL/SURGICAL HISTORY:  Past Medical History:  Diagnosis Date  . Alcohol dependence, continuous ([place]) 05/15/2004   Overview:  ICD10 Conversion  . Anemia   . 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 CLL (chronic lymphocytic leukemia) ([place])   . Depression   . Diabetes mellitus without complication ([place])   . Hypertension   . Hypokalemia   . Leukemia ([place])   . Tobacco abuse 12/21/2011  Quit in July 2016   Past Surgical History:  Procedure Laterality Date  . ABDOMINAL HYSTERECTOMY    . PORT A CATH INJECTION ([facility] HX)       SOCIAL HISTORY:  Social History   Socioeconomic History  . Marital status: Married    Spouse name: Not on file  . Number of children: Not on file  . Years  of education: Not on file  . Highest education level: Not on file  Occupational History  . Not on file  Tobacco Use  . Smoking status: Former Smoker    Packs/day: 1.00    Years: 45.00    Pack years: 45.00    Types: Cigarettes    Quit date: 09/15/2014    Years since quitting: 4.6  . Smokeless tobacco: Former Network engineer and Sexual Activity  . Alcohol use: Yes    Alcohol/week: 2.0 standard drinks    Types: 2 Shots of liquor per week  . Drug use: No  . Sexual activity: Not on file  Other Topics Concern  . Not on file  Social History Narrative  . Not on file   Social Determinants of Health   Financial Resource Strain:   . Difficulty of Paying Living Expenses:   Food Insecurity:   . Worried About Charity fundraiser in the Last Year:   . Arboriculturist in the Last Year:   Transportation Needs:   . Film/video editor (Medical):   Marland Kitchen Lack of Transportation (Non-Medical):   Physical Activity:   . Days of Exercise per Week:   . Minutes of Exercise per Session:   Stress:   . Feeling of Stress :   Social Connections:   . Frequency of Communication with Friends and Family:   . Frequency of Social Gatherings with Friends and Family:   . Attends Religious Services:   . Active Member of Clubs or Organizations:   . Attends Archivist Meetings:   Marland Kitchen Marital Status:   Intimate Partner Violence:   . Fear of Current or Ex-Partner:   . Emotionally Abused:   Marland Kitchen Physically Abused:   . Sexually Abused:     FAMILY HISTORY:  Family History  Problem Relation Age of Onset  . Heart failure Mother   . Diabetes Mother   . Hypertension Mother   . Heart failure Father   . Colon cancer Neg Hx     CURRENT MEDICATIONS:  Outpatient Encounter Medications as of 05/06/2019  Medication Sig Note  . atorvastatin (LIPITOR) 20 MG tablet Take 20 mg by mouth daily at 6 PM.    . IMBRUVICA 420 MG TABS TAKE ONE TABLET BY MOUTH ONCE DAILY WITH A FULL GLASS OF WATER   .  lisinopril-hydrochlorothiazide (PRINZIDE,ZESTORETIC) 20-12.5 MG tablet Take 1 tablet by mouth daily.    . Multiple Vitamins-Minerals (THERA-M) TABS Take by mouth. 09/15/2015: Received from: [facility]: Take 1 tablet by mouth daily.  . potassium chloride SA (K-DUR,KLOR-CON) 20 MEQ tablet Take 2 tablets (40 mEq total) by mouth 2 (two) times daily.   Marland Kitchen lidocaine-prilocaine (EMLA) cream Apply 1 application topically as needed. (Patient not taking: Reported on 05/06/2019)   . tizanidine (ZANAFLEX) 2 MG capsule Take 2 mg by mouth as needed.     Facility-Administered Encounter Medications as of 05/06/2019  Medication  . sodium chloride flush (NS) 0.9 % injection 10 mL    ALLERGIES:  Allergies  Allergen Reactions  . Aspirin Nausea And Vomiting  .  Levaquin  [Levofloxacin In D5w] Rash     PHYSICAL EXAM:  ECOG Performance status: 1  Vitals:   05/06/19 1422  BP: 123/77  Pulse: 72  Resp: 18  Temp: (!) 96.9 F (36.1 C)  SpO2: 100%   Filed Weights   05/06/19 1422  Weight: 168 lb 4.8 oz (76.3 kg)    Physical Exam Vitals reviewed.  Constitutional:      Appearance: Normal appearance.  Cardiovascular:     Rate and Rhythm: Normal rate and regular rhythm.     Heart sounds: Normal heart sounds.  Pulmonary:     Effort: Pulmonary effort is normal.     Breath sounds: Normal breath sounds.  Abdominal:     General: There is no distension.     Palpations: Abdomen is soft. There is no mass.  Musculoskeletal:        General: No swelling.  Lymphadenopathy:     Cervical: No cervical adenopathy.  Skin:    General: Skin is warm.  Neurological:     General: No focal deficit present.     Mental Status: She is alert and oriented to person, place, and time.  Psychiatric:        Mood and Affect: Mood normal.        Behavior: Behavior normal.      LABORATORY DATA:  I have reviewed the labs as listed.  CBC    Component Value Date/Time   WBC 8.3 04/29/2019 1343   RBC 4.57  04/29/2019 1343   HGB 12.5 04/29/2019 1343   HCT 39.7 04/29/2019 1343   PLT 262 04/29/2019 1343   MCV 86.9 04/29/2019 1343   MCH 27.4 04/29/2019 1343   MCHC 31.5 04/29/2019 1343   RDW 15.3 04/29/2019 1343   LYMPHSABS 4.1 (H) 04/29/2019 1343   MONOABS 0.6 04/29/2019 1343   EOSABS 0.3 04/29/2019 1343   BASOSABS 0.1 04/29/2019 1343   CMP Latest Ref Rng & Units 04/29/2019 02/18/2019 10/08/2018  Glucose 70 - 99 mg/dL 100(H) 105(H) 93  BUN 8 - 23 mg/dL 30(H) 27(H) 24(H)  Creatinine 0.44 - 1.00 mg/dL 0.90 0.87 1.07(H)  Sodium 135 - 145 mmol/L 140 139 140  Potassium 3.5 - 5.1 mmol/L 3.4(L) 3.2(L) 3.7  Chloride 98 - 111 mmol/L 109 108 107  CO2 22 - 32 mmol/L 23 20(L) 24  Calcium 8.9 - 10.3 mg/dL 8.8(L) 9.0 9.1  Total Protein 6.5 - 8.1 g/dL 6.8 7.3 7.1  Total Bilirubin 0.3 - 1.2 mg/dL 0.5 0.9 0.8  Alkaline Phos 38 - 126 U/L 60 50 45  AST 15 - 41 U/L 19 19 18   ALT 0 - 44 U/L 21 19 17        DIAGNOSTIC IMAGING:  I have independently reviewed the scans.    ASSESSMENT & PLAN:   CLL (chronic lymphocytic leukemia) ([place]) 1.  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 -Ibrutinib 420 mg daily started in May 2017. -She started back Ibrutinib in January 2021 after not taking it for a month. -She is tolerating it very well.  She denies any fevers, night sweats or weight loss. -Physical exam did not reveal any palpable adenopathy or splenomegaly. -We reviewed her labs which are grossly within normal limits. -I will see her back in 6 months for follow-up with repeat labs on the same day.  She will continue port flush every 3 months.  2.  Left lower lobe lung nodule: -CT scan of the chest on 8 92,020  shows 5 mm left lower lobe lung nodule which is stable, mostly benign.  3.  Mild hypokalemia: -Potassium is 3.4.  She is already on potassium 40 mg twice daily.    Orders placed this encounter:  Orders Placed This Encounter  Procedures   . CBC with Differential/Platelet  . Comprehensive metabolic panel  . Lactate dehydrogenase      Derek Jack, MD [place] [phone]

## 2019-05-06 NOTE — Assessment & Plan Note (Signed)
1.  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 -Ibrutinib 420 mg daily started in May 2017. -She started back Ibrutinib in January 2021 after not taking it for a month. -She is tolerating it very well.  She denies any fevers, night sweats or weight loss. -Physical exam did not reveal any palpable adenopathy or splenomegaly. -We reviewed her labs which are grossly within normal limits. -I will see her back in 6 months for follow-up with repeat labs on the same day.  She will continue port flush every 3 months.  2.  Left lower lobe lung nodule: -CT scan of the chest on 8 92,020 shows 5 mm left lower lobe lung nodule which is stable, mostly benign.  3.  Mild hypokalemia: -Potassium is 3.4.  She is already on potassium 40 mg twice daily.

## 2019-05-06 NOTE — Patient Instructions (Signed)
[facility] at [facility] Discharge Instructions  You were seen today by Dr. Katragadda. He went over your recent lab results. He will see you back in 6 months for labs and follow up.   Thank you for choosing  [facility] at [facility] to provide your oncology and hematology care.  To afford each patient quality time with our provider, please arrive at least 15 minutes before your scheduled appointment time.   If you have a lab appointment with the Cancer Center please come in thru the  Main Entrance and check in at the main information desk  You need to re-schedule your appointment should you arrive 10 or more minutes late.  We strive to give you quality time with our providers, and arriving late affects you and other patients whose appointments are after yours.  Also, if you no show three or more times for appointments you may be dismissed from the clinic at the providers discretion.     Again, thank you for choosing [facility].  Our hope is that these requests will decrease the amount of time that you wait before being seen by our physicians.       _____________________________________________________________  Should you have questions after your visit to [facility], please contact our office at [phone] between the hours of 8:00 a.m. and 4:30 p.m.  Voicemails left after 4:00 p.m. will not be returned until the following business day.  For prescription refill requests, have your pharmacy contact our office and allow 72 hours.    Cancer Center Support Programs:   > Cancer Support Group  2nd Tuesday of the month 1pm-2pm, Journey Room    

## 2019-05-08 ENCOUNTER — Other Ambulatory Visit (HOSPITAL_COMMUNITY): Payer: Self-pay | Admitting: Nurse Practitioner

## 2019-05-18 ENCOUNTER — Encounter (HOSPITAL_COMMUNITY): Payer: BC Managed Care – PPO

## 2019-06-04 ENCOUNTER — Other Ambulatory Visit (HOSPITAL_COMMUNITY): Payer: Self-pay | Admitting: Nurse Practitioner

## 2019-06-04 ENCOUNTER — Other Ambulatory Visit (HOSPITAL_COMMUNITY): Payer: Self-pay | Admitting: *Deleted

## 2019-06-04 MED ORDER — IMBRUVICA 420 MG PO TABS: 1.0000 | ORAL_TABLET | Freq: Every day | ORAL | 0 refills | Status: DC

## 2019-06-04 NOTE — Telephone Encounter (Signed)
Refilled imbruvica per Francene Finders, NP

## 2019-06-26 ENCOUNTER — Other Ambulatory Visit (HOSPITAL_COMMUNITY): Payer: Self-pay | Admitting: *Deleted

## 2019-06-26 MED ORDER — IMBRUVICA 420 MG PO TABS: 1.0000 | ORAL_TABLET | Freq: Every day | ORAL | 5 refills | Status: DC

## 2019-06-26 NOTE — Telephone Encounter (Signed)
Per last office note patient is to continue imbruvica, refills sent to the pharmacy

## 2019-07-29 ENCOUNTER — Encounter (HOSPITAL_COMMUNITY): Payer: Self-pay

## 2019-07-29 ENCOUNTER — Inpatient Hospital Stay (HOSPITAL_COMMUNITY): Payer: BC Managed Care – PPO | Attending: Nurse Practitioner

## 2019-07-29 DIAGNOSIS — Z452 Encounter for adjustment and management of vascular access device: Secondary | ICD-10-CM | POA: Diagnosis not present

## 2019-07-29 DIAGNOSIS — C9110 Chronic lymphocytic leukemia of B-cell type not having achieved remission: Secondary | ICD-10-CM | POA: Insufficient documentation

## 2019-07-29 MED ORDER — HEPARIN SOD (PORK) LOCK FLUSH 100 UNIT/ML IV SOLN
500.0000 [IU] | Freq: Once | INTRAVENOUS | Status: AC
  Administered 2019-07-29: 500 [IU] via INTRAVENOUS

## 2019-07-29 MED ORDER — SODIUM CHLORIDE 0.9% FLUSH
10.0000 mL | INTRAVENOUS | Status: DC | PRN
  Administered 2019-07-29: 10 mL via INTRAVENOUS

## 2019-07-29 NOTE — Patient Instructions (Signed)
[facility] at [facility] Discharge Instructions  Portacath flushed per protocol today. Follow-up as scheduled   Thank you for choosing  Cancer Center at [facility] to provide your oncology and hematology care.  To afford each patient quality time with our provider, please arrive at least 15 minutes before your scheduled appointment time.   If you have a lab appointment with the Cancer Center please come in thru the Main Entrance and check in at the main information desk.  You need to re-schedule your appointment should you arrive 10 or more minutes late.  We strive to give you quality time with our providers, and arriving late affects you and other patients whose appointments are after yours.  Also, if you no show three or more times for appointments you may be dismissed from the clinic at the providers discretion.     Again, thank you for choosing [facility].  Our hope is that these requests will decrease the amount of time that you wait before being seen by our physicians.       _____________________________________________________________  Should you have questions after your visit to [facility], please contact our office at [phone] between the hours of 8:00 a.m. and 4:30 p.m.  Voicemails left after 4:00 p.m. will not be returned until the following business day.  For prescription refill requests, have your pharmacy contact our office and allow 72 hours.    Due to Covid, you will need to wear a mask upon entering the hospital. If you do not have a mask, a mask will be given to you at the Main Entrance upon arrival. For doctor visits, patients may have 1 support person with them. For treatment visits, patients can not have anyone with them due to social distancing guidelines and our immunocompromised population.     

## 2019-07-29 NOTE — Progress Notes (Signed)
Brittany Rowland tolerated portacath flush well without complaints or incident. Port accessed with 20 gauge needle with blood return noted then flushed easily with 10 ml NS and 5 ml Heparin easily per protocol then de-accessed. VSS Pt discharged self ambulatory using her cane

## 2019-07-30 ENCOUNTER — Encounter (HOSPITAL_COMMUNITY): Payer: BC Managed Care – PPO

## 2019-11-04 ENCOUNTER — Other Ambulatory Visit (HOSPITAL_COMMUNITY): Payer: BC Managed Care – PPO

## 2019-11-09 ENCOUNTER — Ambulatory Visit (HOSPITAL_COMMUNITY): Payer: BC Managed Care – PPO | Admitting: Hematology

## 2019-11-09 ENCOUNTER — Encounter (HOSPITAL_COMMUNITY): Payer: Self-pay

## 2019-11-09 ENCOUNTER — Inpatient Hospital Stay (HOSPITAL_COMMUNITY): Payer: BC Managed Care – PPO | Attending: Hematology

## 2019-11-09 ENCOUNTER — Other Ambulatory Visit: Payer: Self-pay

## 2019-11-09 DIAGNOSIS — C9110 Chronic lymphocytic leukemia of B-cell type not having achieved remission: Secondary | ICD-10-CM | POA: Diagnosis present

## 2019-11-09 LAB — CBC WITH DIFFERENTIAL/PLATELET
Abs Immature Granulocytes: 0.02 10*3/uL (ref 0.00–0.07)
Basophils Absolute: 0.1 10*3/uL (ref 0.0–0.1)
Basophils Relative: 1 %
Eosinophils Absolute: 0.6 10*3/uL — ABNORMAL HIGH (ref 0.0–0.5)
Eosinophils Relative: 6 %
HCT: 38.1 % (ref 36.0–46.0)
Hemoglobin: 12 g/dL (ref 12.0–15.0)
Lymphocytes Relative: 55 %
Lymphs Abs: 5.6 10*3/uL — ABNORMAL HIGH (ref 0.7–4.0)
MCH: 25.9 pg — ABNORMAL LOW (ref 26.0–34.0)
MCHC: 31.5 g/dL (ref 30.0–36.0)
MCV: 82.1 fL (ref 80.0–100.0)
Monocytes Absolute: 0.3 10*3/uL (ref 0.1–1.0)
Monocytes Relative: 3 %
Neutro Abs: 3.6 10*3/uL (ref 1.7–7.7)
Neutrophils Relative %: 35 %
Platelets: 364 10*3/uL (ref 150–400)
RBC: 4.64 MIL/uL (ref 3.87–5.11)
RDW: 16 % — ABNORMAL HIGH (ref 11.5–15.5)
WBC: 10.2 10*3/uL (ref 4.0–10.5)
nRBC: 0 % (ref 0.0–0.2)

## 2019-11-09 LAB — COMPREHENSIVE METABOLIC PANEL
ALT: 17 U/L (ref 0–44)
AST: 19 U/L (ref 15–41)
Albumin: 3.9 g/dL (ref 3.5–5.0)
Alkaline Phosphatase: 56 U/L (ref 38–126)
Anion gap: 13 (ref 5–15)
BUN: 19 mg/dL (ref 8–23)
CO2: 22 mmol/L (ref 22–32)
Calcium: 9.4 mg/dL (ref 8.9–10.3)
Chloride: 102 mmol/L (ref 98–111)
Creatinine, Ser: 1.01 mg/dL — ABNORMAL HIGH (ref 0.44–1.00)
GFR calc Af Amer: >60 mL/min (ref >60)
GFR calc non Af Amer: 57 mL/min — ABNORMAL LOW (ref >60)
Glucose, Bld: 115 mg/dL — ABNORMAL HIGH (ref 70–99)
Potassium: 3.5 mmol/L (ref 3.5–5.1)
Sodium: 137 mmol/L (ref 135–145)
Total Bilirubin: 0.5 mg/dL (ref 0.3–1.2)
Total Protein: 7.4 g/dL (ref 6.5–8.1)

## 2019-11-09 LAB — LACTATE DEHYDROGENASE: LDH: 130 U/L (ref 98–192)

## 2019-11-09 MED ORDER — HEPARIN SOD (PORK) LOCK FLUSH 100 UNIT/ML IV SOLN
500.0000 [IU] | Freq: Once | INTRAVENOUS | Status: AC
  Administered 2019-11-09: 500 [IU] via INTRAVENOUS

## 2019-11-09 MED ORDER — SODIUM CHLORIDE 0.9% FLUSH
20.0000 mL | INTRAVENOUS | Status: DC | PRN
  Administered 2019-11-09: 20 mL via INTRAVENOUS

## 2019-11-09 NOTE — Patient Instructions (Signed)
[facility] at [facility] Discharge Instructions  Labs drawn from portacath today. Follow-up as scheduled   Thank you for choosing [facility] at [facility] to provide your oncology and hematology care.  To afford each patient quality time with our provider, please arrive at least 15 minutes before your scheduled appointment time.   If you have a lab appointment with the Cancer Center please come in thru the Main Entrance and check in at the main information desk.  You need to re-schedule your appointment should you arrive 10 or more minutes late.  We strive to give you quality time with our providers, and arriving late affects you and other patients whose appointments are after yours.  Also, if you no show three or more times for appointments you may be dismissed from the clinic at the providers discretion.     Again, thank you for choosing [facility].  Our hope is that these requests will decrease the amount of time that you wait before being seen by our physicians.       _____________________________________________________________  Should you have questions after your visit to [facility], please contact our office at [phone] and follow the prompts.  Our office hours are 8:00 a.m. and 4:30 p.m. Monday - Friday.  Please note that voicemails left after 4:00 p.m. may not be returned until the following business day.  We are closed weekends and major holidays.  You do have access to a nurse 24-7, just call the main number to the clinic [phone] and do not press any options, hold on the line and a nurse will answer the phone.    For prescription refill requests, have your pharmacy contact our office and allow 72 hours.    Due to Covid, you will need to wear a mask upon entering the hospital. If you do not have a mask, a mask will be given to you at the Main Entrance upon arrival. For doctor visits, patients may have  1 support person age 18 or older with them. For treatment visits, patients can not have anyone with them due to social distancing guidelines and our immunocompromised population.     

## 2019-11-09 NOTE — Progress Notes (Signed)
Brittany Rowland tolerated portacath lab draw well without complaints or incident. Port accessed with 20 gauge needle with blood drawn for labs ordered then flushed easily per protocol and de-accessed. VSS Pt discharged self ambulatory using her cane in satisfactory condition

## 2019-11-10 LAB — PATHOLOGIST SMEAR REVIEW

## 2019-11-11 ENCOUNTER — Ambulatory Visit (HOSPITAL_COMMUNITY): Payer: BC Managed Care – PPO | Admitting: Hematology

## 2019-11-17 ENCOUNTER — Inpatient Hospital Stay (HOSPITAL_COMMUNITY): Payer: BC Managed Care – PPO | Admitting: Hematology

## 2019-11-25 ENCOUNTER — Other Ambulatory Visit: Payer: Self-pay

## 2019-11-25 ENCOUNTER — Inpatient Hospital Stay (HOSPITAL_COMMUNITY): Payer: BC Managed Care – PPO | Attending: Hematology | Admitting: Hematology

## 2019-11-25 VITALS — BP 105/75 | HR 93 | Temp 97.1°F | Resp 17 | Wt 158.0 lb

## 2019-11-25 DIAGNOSIS — M1990 Unspecified osteoarthritis, unspecified site: Secondary | ICD-10-CM | POA: Insufficient documentation

## 2019-11-25 DIAGNOSIS — Z79899 Other long term (current) drug therapy: Secondary | ICD-10-CM | POA: Insufficient documentation

## 2019-11-25 DIAGNOSIS — D649 Anemia, unspecified: Secondary | ICD-10-CM | POA: Insufficient documentation

## 2019-11-25 DIAGNOSIS — C9110 Chronic lymphocytic leukemia of B-cell type not having achieved remission: Secondary | ICD-10-CM | POA: Insufficient documentation

## 2019-11-25 DIAGNOSIS — E876 Hypokalemia: Secondary | ICD-10-CM | POA: Insufficient documentation

## 2019-11-25 DIAGNOSIS — E119 Type 2 diabetes mellitus without complications: Secondary | ICD-10-CM | POA: Insufficient documentation

## 2019-11-25 DIAGNOSIS — I10 Essential (primary) hypertension: Secondary | ICD-10-CM | POA: Insufficient documentation

## 2019-11-25 DIAGNOSIS — Z87891 Personal history of nicotine dependence: Secondary | ICD-10-CM | POA: Insufficient documentation

## 2019-11-25 DIAGNOSIS — M109 Gout, unspecified: Secondary | ICD-10-CM | POA: Insufficient documentation

## 2019-11-25 DIAGNOSIS — R911 Solitary pulmonary nodule: Secondary | ICD-10-CM | POA: Insufficient documentation

## 2019-11-25 MED ORDER — IMBRUVICA 420 MG PO TABS: 1.0000 | ORAL_TABLET | Freq: Every day | ORAL | 5 refills | Status: DC

## 2019-11-25 NOTE — Patient Instructions (Signed)
[place] at [facility] Discharge Instructions  You were seen today by Dr. Delton Coombes. He went over your recent results. Continue taking Imbruvica daily. Dr. Delton Coombes will see you back in 3 months for labs and follow up.   Thank you for choosing [place] at [facility] to provide your oncology and hematology care.  To afford each patient quality time with our provider, please arrive at least 15 minutes before your scheduled appointment time.   If you have a lab appointment with the [facility] please come in thru the Main Entrance and check in at the main information desk  You need to re-schedule your appointment should you arrive 10 or more minutes late.  We strive to give you quality time with our providers, and arriving late affects you and other patients whose appointments are after yours.  Also, if you no show three or more times for appointments you may be dismissed from the clinic at the providers discretion.     Again, thank you for choosing [facility].  Our hope is that these requests will decrease the amount of time that you wait before being seen by our physicians.       _____________________________________________________________  Should you have questions after your visit to [facility], please contact our office at [phone] between the hours of 8:00 a.m. and 4:30 p.m.  Voicemails left after 4:00 p.m. will not be returned until the following business day.  For prescription refill requests, have your pharmacy contact our office and allow 72 hours.    Cancer Center Support Programs:   > Cancer Support Group  2nd Tuesday of the month 1pm-2pm, Journey Room

## 2019-11-25 NOTE — Progress Notes (Signed)
[place] [place], [place] [address]   CLINIC:  Medical Oncology/Hematology  PCP:  Leory Plowman [facility] / [place] [address]  [phone]  REASON FOR VISIT:  Follow-up for CLL  PRIOR THERAPY: Bendamustine and rituximab  CURRENT THERAPY: Ibrutinib daily  INTERVAL HISTORY:  [name], a 69 y.o. female, returns for routine follow-up for her CLL. Brittany Rowland was last seen on 05/06/2019.  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 Her gout and arthritis are being treated by her PCP. She has not taken ibrutinib for the last 2 months. She denies having F/C, though she gets some night sweats.  She got of her COVID vaccines in June 2021.   REVIEW OF SYSTEMS:  Review of Systems  Constitutional: Positive for appetite change (75%), diaphoresis (night sweats) and fatigue (75%). Negative for chills and fever.  Musculoskeletal: Positive for arthralgias (arthritis and gout in feet and legs).  All other systems reviewed and are negative.   PAST MEDICAL/SURGICAL HISTORY:  Past Medical History:  Diagnosis Date  . Alcohol dependence, continuous ([place]) 05/15/2004   Overview:  ICD10 Conversion  . Anemia   . 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 CLL (chronic lymphocytic leukemia) ([place])   . Depression   . Diabetes mellitus without complication ([place])   . Hypertension   . Hypokalemia   . Leukemia ([place])   . Tobacco abuse 12/21/2011   Quit in July 2016   Past Surgical History:  Procedure Laterality Date  . ABDOMINAL HYSTERECTOMY    . PORT A CATH INJECTION ([facility] HX)      SOCIAL HISTORY:  Social History   Socioeconomic History  . Marital status: Married    Spouse name: Not on file  . Number of children: Not on file  . Years of education: Not on file  . Highest education level: Not on file  Occupational History  . Not on file    Tobacco Use  . Smoking status: Former Smoker    Packs/day: 1.00    Years: 45.00    Pack years: 45.00    Types: Cigarettes    Quit date: 09/15/2014    Years since quitting: 5.1  . Smokeless tobacco: Former Network engineer and Sexual Activity  . Alcohol use: Yes    Alcohol/week: 2.0 standard drinks    Types: 2 Shots of liquor per week  . Drug use: No  . Sexual activity: Not on file  Other Topics Concern  . Not on file  Social History Narrative  . Not on file   Social Determinants of Health   Financial Resource Strain:   . Difficulty of Paying Living Expenses: Not on file  Food Insecurity:   . Worried About Charity fundraiser in the Last Year: Not on file  . Ran Out of Food in the Last Year: Not on file  Transportation Needs:   . Lack of Transportation (Medical): Not on file  . Lack of Transportation (Non-Medical): Not on file  Physical Activity:   . Days of Exercise per Week: Not on file  . Minutes of Exercise per Session: Not on file  Stress:   . Feeling of Stress : Not on file  Social Connections:   . Frequency of Communication with Friends and Family: Not on file  . Frequency of Social Gatherings with Friends and Family: Not on  file  . Attends Religious Services: Not on file  . Active Member of Clubs or Organizations: Not on file  . Attends Archivist Meetings: Not on file  . Marital Status: Not on file  Intimate Partner Violence:   . Fear of Current or Ex-Partner: Not on file  . Emotionally Abused: Not on file  . Physically Abused: Not on file  . Sexually Abused: Not on file    FAMILY HISTORY:  Family History  Problem Relation Age of Onset  . Heart failure Mother   . Diabetes Mother   . Hypertension Mother   . Heart failure Father   . Colon cancer Neg Hx     CURRENT MEDICATIONS:  Current Outpatient Medications  Medication Sig Dispense Refill  . atorvastatin (LIPITOR) 20 MG tablet Take 20 mg by mouth daily at 6 PM.     . colchicine 0.6 MG  tablet Take 0.6 mg by mouth daily.    . diclofenac (VOLTAREN) 50 MG EC tablet Take 50 mg by mouth 2 (two) times daily.    Marland Kitchen lidocaine-prilocaine (EMLA) cream Apply 1 application topically as needed. 30 g 1  . lisinopril-hydrochlorothiazide (PRINZIDE,ZESTORETIC) 20-12.5 MG tablet Take 1 tablet by mouth daily.     . Multiple Vitamins-Minerals (THERA-M) TABS Take by mouth.    . potassium chloride SA (K-DUR,KLOR-CON) 20 MEQ tablet Take 2 tablets (40 mEq total) by mouth 2 (two) times daily. 30 tablet 0  . predniSONE (DELTASONE) 20 MG tablet Take 20 mg by mouth 3 (three) times daily.    . tizanidine (ZANAFLEX) 2 MG capsule Take 2 mg by mouth as needed.     Marland Kitchen tiZANidine (ZANAFLEX) 2 MG tablet     . Ibrutinib (IMBRUVICA) 420 MG TABS Take 1 tablet by mouth daily. With a full glass of water 28 tablet 5   No current facility-administered medications for this visit.   Facility-Administered Medications Ordered in Other Visits  Medication Dose Route Frequency Provider Last Rate Last Admin  . sodium chloride flush (NS) 0.9 % injection 10 mL  10 mL Intravenous PRN Derek Jack, MD   10 mL at 03/19/18 1137    ALLERGIES:  Allergies  Allergen Reactions  . Aspirin Nausea And Vomiting  . Levaquin  [Levofloxacin In D5w] Rash    PHYSICAL EXAM:  Performance status (ECOG): 1 - Symptomatic but completely ambulatory  Vitals:   11/25/19 1524  BP: 105/75  Pulse: 93  Resp: 17  Temp: (!) 97.1 F (36.2 C)  SpO2: 97%   Wt Readings from Last 3 Encounters:  11/25/19 158 lb (71.7 kg)  05/06/19 168 lb 4.8 oz (76.3 kg)  02/25/19 168 lb 6.4 oz (76.4 kg)   Physical Exam Vitals reviewed.  Constitutional:      Appearance: Normal appearance.  Cardiovascular:     Rate and Rhythm: Normal rate and regular rhythm.     Pulses: Normal pulses.     Heart sounds: Normal heart sounds.  Pulmonary:     Effort: Pulmonary effort is normal.     Breath sounds: Normal breath sounds.  Abdominal:     Palpations:  Abdomen is soft. There is no hepatomegaly, splenomegaly or mass.     Tenderness: There is no abdominal tenderness.     Hernia: No hernia is present.  Musculoskeletal:     Right lower leg: No edema.     Left lower leg: No edema.  Neurological:     General: No focal deficit present.     Mental  Status: She is alert and oriented to person, place, and time.  Psychiatric:        Mood and Affect: Mood normal.        Behavior: Behavior normal.     LABORATORY DATA:  I have reviewed the labs as listed.  CBC Latest Ref Rng & Units 11/09/2019 04/29/2019 02/18/2019  WBC 4.0 - 10.5 K/uL 10.2 8.3 6.6  Hemoglobin 12.0 - 15.0 g/dL 12.0 12.5 12.7  Hematocrit 36 - 46 % 38.1 39.7 40.0  Platelets 150 - 400 K/uL 364 262 324   CMP Latest Ref Rng & Units 11/09/2019 04/29/2019 02/18/2019  Glucose 70 - 99 mg/dL 115(H) 100(H) 105(H)  BUN 8 - 23 mg/dL 19 30(H) 27(H)  Creatinine 0.44 - 1.00 mg/dL 1.01(H) 0.90 0.87  Sodium 135 - 145 mmol/L 137 140 139  Potassium 3.5 - 5.1 mmol/L 3.5 3.4(L) 3.2(L)  Chloride 98 - 111 mmol/L 102 109 108  CO2 22 - 32 mmol/L 22 23 20(L)  Calcium 8.9 - 10.3 mg/dL 9.4 8.8(L) 9.0  Total Protein 6.5 - 8.1 g/dL 7.4 6.8 7.3  Total Bilirubin 0.3 - 1.2 mg/dL 0.5 0.5 0.9  Alkaline Phos 38 - 126 U/L 56 60 50  AST 15 - 41 U/L 19 19 19   ALT 0 - 44 U/L 17 21 19       Component Value Date/Time   RBC 4.64 11/09/2019 1337   MCV 82.1 11/09/2019 1337   MCH 25.9 (L) 11/09/2019 1337   MCHC 31.5 11/09/2019 1337   RDW 16.0 (H) 11/09/2019 1337   LYMPHSABS 5.6 (H) 11/09/2019 1337   MONOABS 0.3 11/09/2019 1337   EOSABS 0.6 (H) 11/09/2019 1337   BASOSABS 0.1 11/09/2019 1337   Lab Results  Component Value Date   LDH 130 11/09/2019   LDH 154 04/29/2019   LDH 161 02/18/2019    DIAGNOSTIC IMAGING:  I have independently reviewed the scans and discussed with the patient. No results found.   ASSESSMENT:  1.  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. -Ibrutinib 420 mg daily started in May 2017. -She started back Ibrutinib in January 2021 after not taking it for a month. -She has not been taking ibrutinib for the past couple of months as she did not receive refills.  2.  Left lower lobe lung nodule: -CT scan of the chest on 10/08/2018 shows 5 mm left lower lobe lung nodule which is stable, mostly benign.   PLAN:  1.  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 -I reviewed her labs which showed white count trending up at 10.2.  Absolute lymphocyte count is also increasing. -We will look into why her pharmacy did not send her a refill.  She was told to start back on the ibrutinib as soon as she receives medication. -Plan to see her back in 3 months for follow-up.  2.  Joint pains: -She reportedly developed joint pains in the toes, and hands and knees. -She was evaluated by urgent care in [place] and was diagnosed with gout.  She was started on colchicine.  3.  Mild hypokalemia: -Continue potassium twice daily.   Orders placed this encounter:  No orders of the defined types were placed in this encounter.    Derek Jack, MD [place] [phone]   I, Milinda Antis, am acting as  a scribe for Dr. Sanda Linger.  I, Derek Jack MD, have reviewed the above documentation for accuracy and completeness, and I agree with the above.

## 2019-11-26 ENCOUNTER — Other Ambulatory Visit (HOSPITAL_COMMUNITY): Payer: Self-pay | Admitting: *Deleted

## 2019-11-26 ENCOUNTER — Telehealth: Payer: Self-pay | Admitting: Pharmacy Technician

## 2019-11-26 MED ORDER — IMBRUVICA 420 MG PO TABS: 1.0000 | ORAL_TABLET | Freq: Every day | ORAL | 5 refills | Status: AC

## 2019-11-27 ENCOUNTER — Telehealth (HOSPITAL_COMMUNITY): Payer: Self-pay | Admitting: Pharmacist

## 2019-11-27 NOTE — Telephone Encounter (Signed)
Oral Oncology Pharmacist Encounter Patient has currently no longer has prescription insurance she will fill ibrutinib at [facility] using one time voucher while wait on the manufacturer assistance progress.  Ibrutinib reassessment: Received prescription for Imbruvica (ibrutinib) for the treatment of CLL, planned duration until disease progression or unacceptable drug toxicity.  CMP from 11/09/19 assessed, no relevant lab abnormalities. Prescription dose and frequency assessed.   Current medication list in Epic reviewed, one DDIs with ibrutinib identified: -diclofenac: ibrutinib may increase the antiplatelet effect of diclofenac. Monitor CBC.  Evaluated chart and a patient barriers to medication adherence identified. Patient currently patient does not have prescription insurance and reports being off of her ibrutinib for 2 months. We are currently working to get her medication in hand again and obtain a long term medication access solution.  Prescription has been e-scribed to the [facility] for benefits analysis and approval.  Oral Oncology Clinic will continue to follow for insurance authorization, copayment issues, initial counseling and start date.  Darl Pikes, PharmD, BCPS, BCOP, CPP Hematology/Oncology Clinical Pharmacist Practitioner [facility] [phone]  11/27/2019 8:51 AM

## 2019-11-27 NOTE — Telephone Encounter (Signed)
Oral Oncology Patient Advocate Encounter  Voucher that we obtained online rejected at pharmacy.  Patient had used a voucher before and could not use it again.    We will have patient pick up a sample from [facility] later next week.  She is aware and agreed to pick it up after Tuesday.  [place] Patient Brittany Rowland Phone [phone] Fax [phone] 11/27/2019 11:20 AM

## 2019-11-27 NOTE — Telephone Encounter (Signed)
Oral Oncology Patient Advocate Encounter  Received a message on 11/26/19 that patient had not received Imbruvica in 2 months from [facility].  I reached out to our [employer] contact, Vonzell Schlatter, and she looked into the patients profile.  The last fill through [employer] was in May 2021.    I performed a pharmacy insurance search and no results were found for commercial or Medicare D.  I called Mrs Brittany Rowland to speak with her about her insurance.  She stated that she retired in May 2021 and now only had Medicare A.  I told her she should reach out to a medicare representative or social services for them to help her choose a plan.  Since patient only has Medicare A, I suggested she sign up for Patient Assistance through [place] and [place].  If approved they will provide her with medication at no cost until December 2021 and she can reapply if needed.  To get her started back on medication while awaiting a decision from [facility], we are going to use a free voucher to provide 28 days of Imbruvica to her.  This will be shipped to her home from [facility].  [place] Patient Kevin Phone [phone] Fax [phone] 11/27/2019 10:48 AM

## 2019-11-27 NOTE — Telephone Encounter (Signed)
Oral Chemotherapy Pharmacist Encounter   Discovered that the patient has already used the one time ibrutinib voucher. Coordinating for patient to pick up sample from [facility] next week.  Darl Pikes, PharmD, BCPS, BCOP, CPP Hematology/Oncology Clinical Pharmacist [facility] [phone]  11/27/2019 11:43 AM

## 2019-12-07 ENCOUNTER — Other Ambulatory Visit (HOSPITAL_COMMUNITY): Payer: Self-pay | Admitting: Family

## 2019-12-07 DIAGNOSIS — Z1239 Encounter for other screening for malignant neoplasm of breast: Secondary | ICD-10-CM

## 2019-12-08 NOTE — Telephone Encounter (Signed)
Called patient regarding sample that was left for her at [facility].  She states that she has not been feeling well and could not pick it up.  She said that her husband came upon a box of Imbruvica that had not been used and that it was in date.  She will use that one in the meantime.  I am going to mail her the [place] application to sign and return.  [place] Patient Brittany Rowland Phone [phone] Fax [phone] 12/08/2019 9:52 AM

## 2020-02-08 ENCOUNTER — Other Ambulatory Visit (HOSPITAL_COMMUNITY): Payer: Self-pay

## 2020-02-08 ENCOUNTER — Encounter (HOSPITAL_COMMUNITY): Payer: Self-pay

## 2020-02-08 ENCOUNTER — Other Ambulatory Visit: Payer: Self-pay

## 2020-02-08 ENCOUNTER — Inpatient Hospital Stay (HOSPITAL_COMMUNITY): Payer: Self-pay | Attending: Medical

## 2020-02-08 DIAGNOSIS — C9110 Chronic lymphocytic leukemia of B-cell type not having achieved remission: Secondary | ICD-10-CM | POA: Insufficient documentation

## 2020-02-08 LAB — CBC WITH DIFFERENTIAL/PLATELET
Abs Immature Granulocytes: 0.02 10*3/uL (ref 0.00–0.07)
Basophils Absolute: 0.1 10*3/uL (ref 0.0–0.1)
Basophils Relative: 1 %
Eosinophils Absolute: 1.1 10*3/uL — ABNORMAL HIGH (ref 0.0–0.5)
Eosinophils Relative: 9 %
HCT: 40.3 % (ref 36.0–46.0)
Hemoglobin: 12.9 g/dL (ref 12.0–15.0)
Immature Granulocytes: 0 %
Lymphocytes Relative: 52 %
Lymphs Abs: 7.1 10*3/uL — ABNORMAL HIGH (ref 0.7–4.0)
MCH: 26.9 pg (ref 26.0–34.0)
MCHC: 32 g/dL (ref 30.0–36.0)
MCV: 84.1 fL (ref 80.0–100.0)
Monocytes Absolute: 2.5 10*3/uL — ABNORMAL HIGH (ref 0.1–1.0)
Monocytes Relative: 19 %
Neutro Abs: 2.5 10*3/uL (ref 1.7–7.7)
Neutrophils Relative %: 19 %
Platelets: 322 10*3/uL (ref 150–400)
RBC: 4.79 MIL/uL (ref 3.87–5.11)
RDW: 16.1 % — ABNORMAL HIGH (ref 11.5–15.5)
WBC Morphology: ABNORMAL
WBC: 13.3 10*3/uL — ABNORMAL HIGH (ref 4.0–10.5)
nRBC: 0 % (ref 0.0–0.2)

## 2020-02-08 LAB — LACTATE DEHYDROGENASE: LDH: 147 U/L (ref 98–192)

## 2020-02-08 LAB — COMPREHENSIVE METABOLIC PANEL
ALT: 21 U/L (ref 0–44)
AST: 21 U/L (ref 15–41)
Albumin: 4.1 g/dL (ref 3.5–5.0)
Alkaline Phosphatase: 57 U/L (ref 38–126)
Anion gap: 9 (ref 5–15)
BUN: 24 mg/dL — ABNORMAL HIGH (ref 8–23)
CO2: 23 mmol/L (ref 22–32)
Calcium: 9.1 mg/dL (ref 8.9–10.3)
Chloride: 105 mmol/L (ref 98–111)
Creatinine, Ser: 0.92 mg/dL (ref 0.44–1.00)
GFR, Estimated: >60 mL/min (ref >60)
Glucose, Bld: 121 mg/dL — ABNORMAL HIGH (ref 70–99)
Potassium: 3.5 mmol/L (ref 3.5–5.1)
Sodium: 137 mmol/L (ref 135–145)
Total Bilirubin: 0.5 mg/dL (ref 0.3–1.2)
Total Protein: 7.1 g/dL (ref 6.5–8.1)

## 2020-02-08 LAB — URIC ACID: Uric Acid, Serum: 8.1 mg/dL — ABNORMAL HIGH (ref 2.5–7.1)

## 2020-02-08 MED ORDER — HEPARIN SOD (PORK) LOCK FLUSH 100 UNIT/ML IV SOLN
500.0000 [IU] | Freq: Once | INTRAVENOUS | Status: AC
  Administered 2020-02-08: 12:00:00 500 [IU] via INTRAVENOUS

## 2020-02-08 MED ORDER — SODIUM CHLORIDE 0.9% FLUSH
20.0000 mL | INTRAVENOUS | Status: DC | PRN
  Administered 2020-02-08: 12:00:00 20 mL via INTRAVENOUS

## 2020-02-08 NOTE — Progress Notes (Signed)
Lamica Mattera tolerated portacath lab draw well without complaints or incident. Port accessed with 20 gauge needle with blood drawn for labs ordered then flushed easily per protocol and de-accessed. VSS Pt discharged self ambulatory in satisfactory condition

## 2020-02-08 NOTE — Patient Instructions (Signed)
[facility] at [facility] Discharge Instructions  Labs drawn from portacath today   Thank you for choosing [facility] at [facility] to provide your oncology and hematology care.  To afford each patient quality time with our provider, please arrive at least 15 minutes before your scheduled appointment time.   If you have a lab appointment with the Cancer Center please come in thru the Main Entrance and check in at the main information desk.  You need to re-schedule your appointment should you arrive 10 or more minutes late.  We strive to give you quality time with our providers, and arriving late affects you and other patients whose appointments are after yours.  Also, if you no show three or more times for appointments you may be dismissed from the clinic at the providers discretion.     Again, thank you for choosing [facility].  Our hope is that these requests will decrease the amount of time that you wait before being seen by our physicians.       _____________________________________________________________  Should you have questions after your visit to [facility], please contact our office at [phone] and follow the prompts.  Our office hours are 8:00 a.m. and 4:30 p.m. Monday - Friday.  Please note that voicemails left after 4:00 p.m. may not be returned until the following business day.  We are closed weekends and major holidays.  You do have access to a nurse 24-7, just call the main number to the clinic [phone] and do not press any options, hold on the line and a nurse will answer the phone.    For prescription refill requests, have your pharmacy contact our office and allow 72 hours.    Due to Covid, you will need to wear a mask upon entering the hospital. If you do not have a mask, a mask will be given to you at the Main Entrance upon arrival. For doctor visits, patients may have 1 support person age 18  or older with them. For treatment visits, patients can not have anyone with them due to social distancing guidelines and our immunocompromised population.     

## 2020-02-24 ENCOUNTER — Ambulatory Visit (HOSPITAL_COMMUNITY): Payer: Self-pay | Admitting: Hematology

## 2020-03-09 ENCOUNTER — Other Ambulatory Visit (HOSPITAL_COMMUNITY): Payer: Self-pay | Admitting: Family

## 2020-03-09 DIAGNOSIS — N63 Unspecified lump in unspecified breast: Secondary | ICD-10-CM

## 2020-03-29 ENCOUNTER — Ambulatory Visit (HOSPITAL_COMMUNITY): Payer: Self-pay

## 2020-03-29 ENCOUNTER — Ambulatory Visit (HOSPITAL_COMMUNITY): Admission: RE | Admit: 2020-03-29 | Payer: Self-pay | Source: Ambulatory Visit

## 2020-03-31 ENCOUNTER — Other Ambulatory Visit: Payer: Self-pay

## 2020-03-31 ENCOUNTER — Inpatient Hospital Stay (HOSPITAL_COMMUNITY): Payer: Self-pay | Admitting: Hematology

## 2020-05-09 ENCOUNTER — Other Ambulatory Visit (HOSPITAL_COMMUNITY): Payer: Self-pay

## 2020-05-09 ENCOUNTER — Inpatient Hospital Stay (HOSPITAL_COMMUNITY): Payer: Self-pay

## 2020-05-16 ENCOUNTER — Ambulatory Visit (HOSPITAL_COMMUNITY): Payer: Self-pay | Admitting: Hematology

## 2020-08-17 IMAGING — US US BREAST*L* LIMITED INC AXILLA
1 series · 6 of 6 positions shown · non-contrast
Comparison: Previous exam(s).

CLINICAL DATA: Follow-up probable fibroadenoma in the 2:30 o'clock
position of the left breast.

EXAM:
ULTRASOUND OF THE LEFT BREAST

[Series 1: us breast*left* limited inc axilla · 0.05mm/px · 6 of 6 slices shown]
[im 1/6]
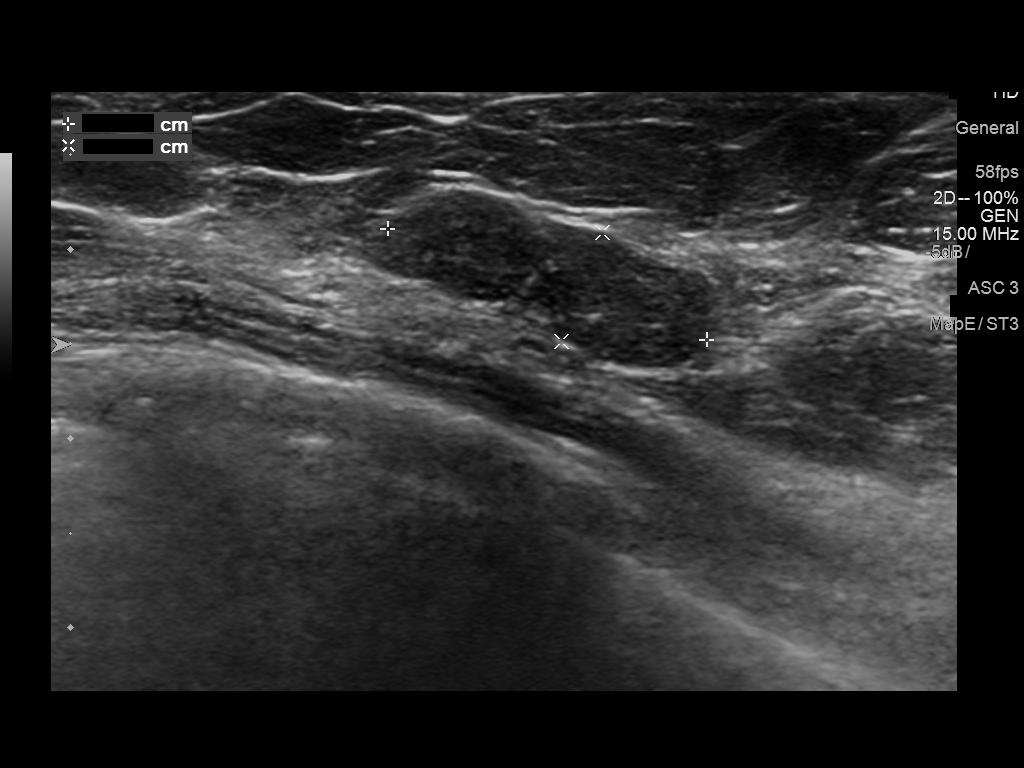
[im 2/6]
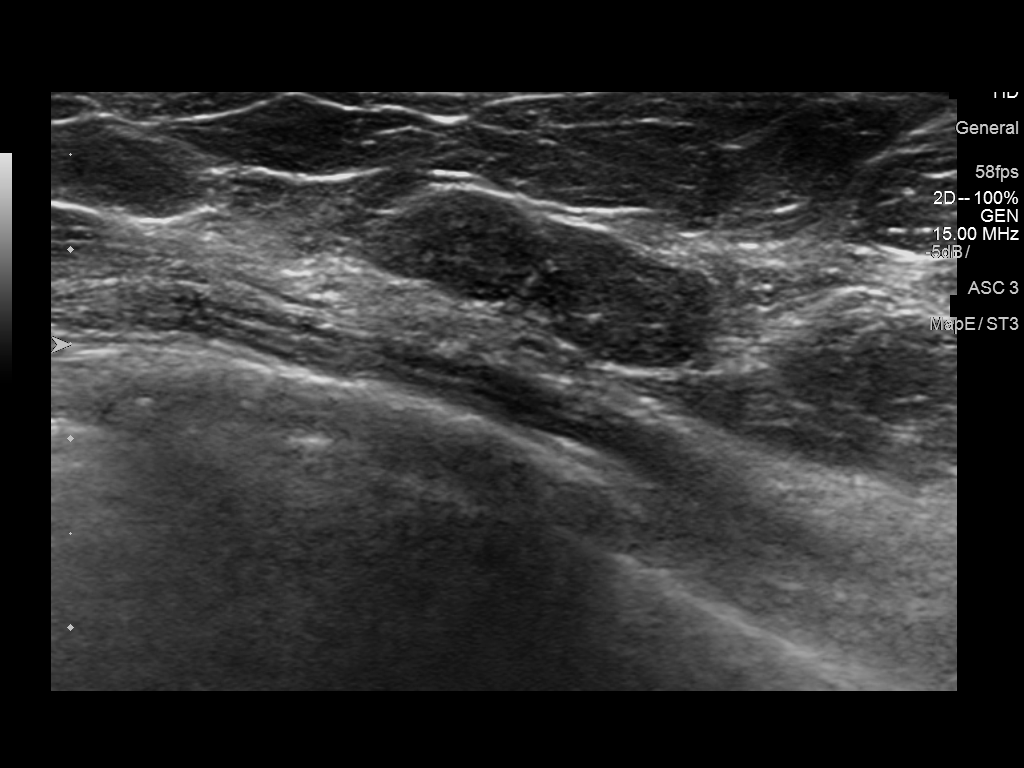
[im 3/6]
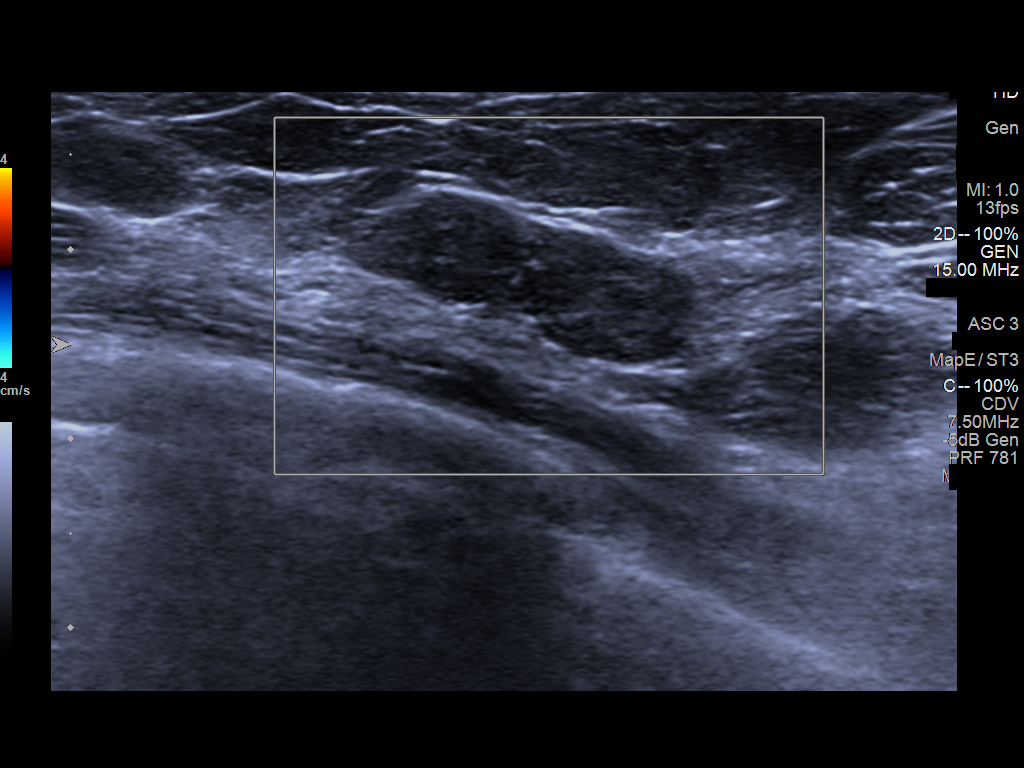
[im 4/6]
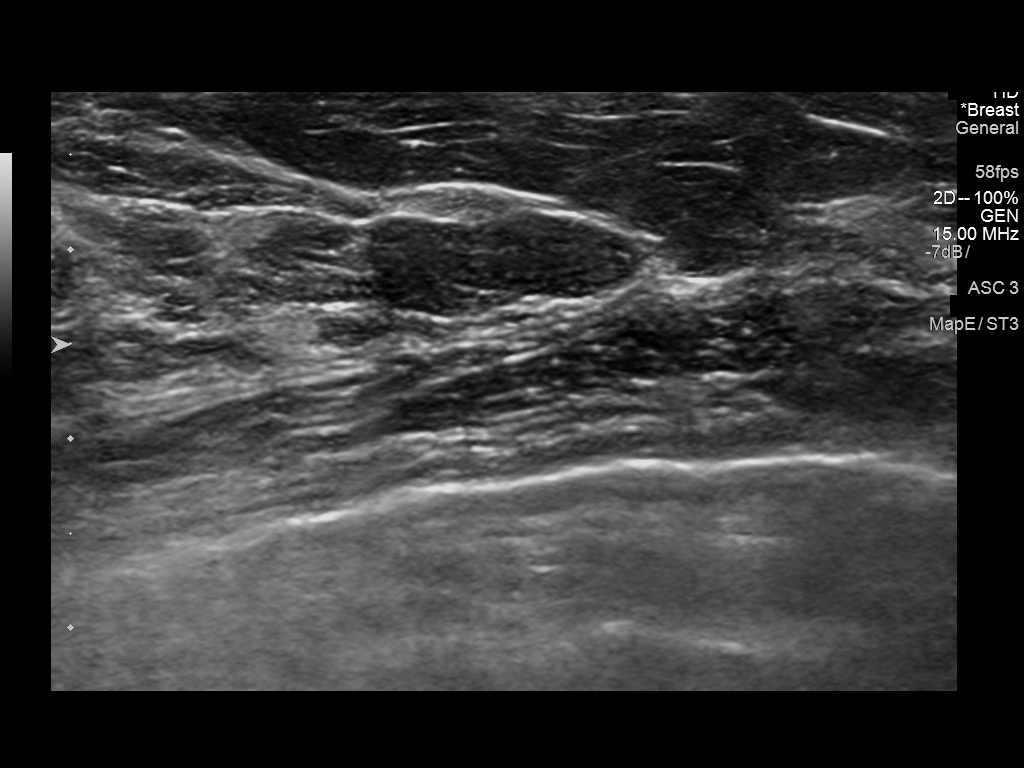
[im 5/6]
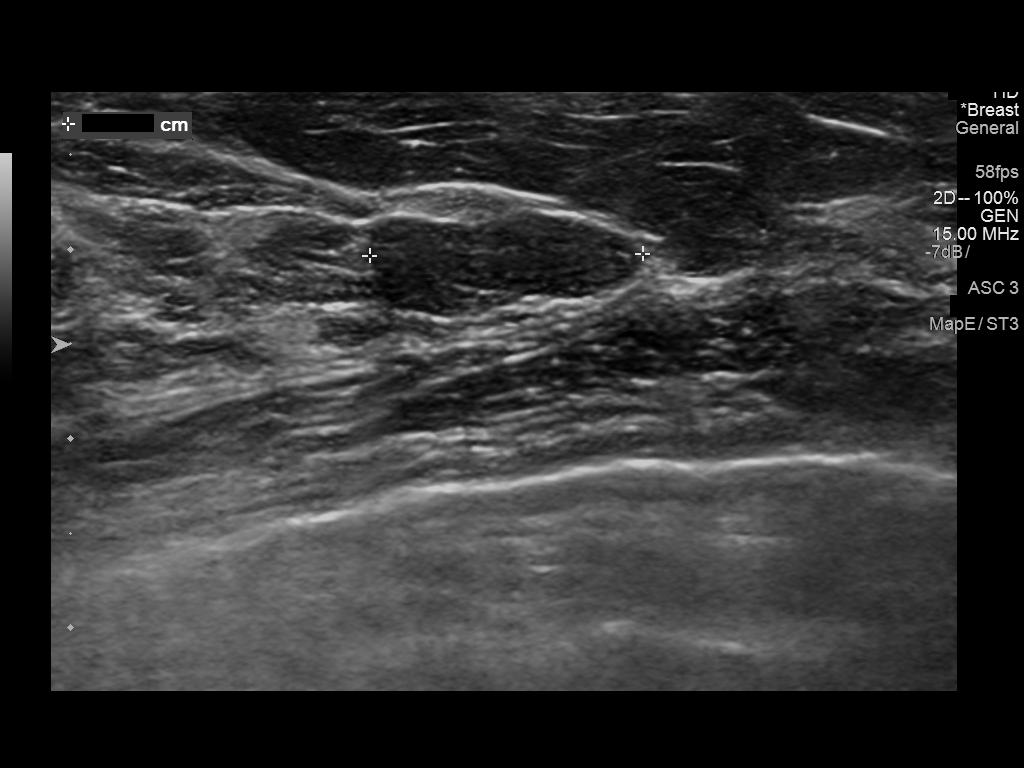
[im 6/6]
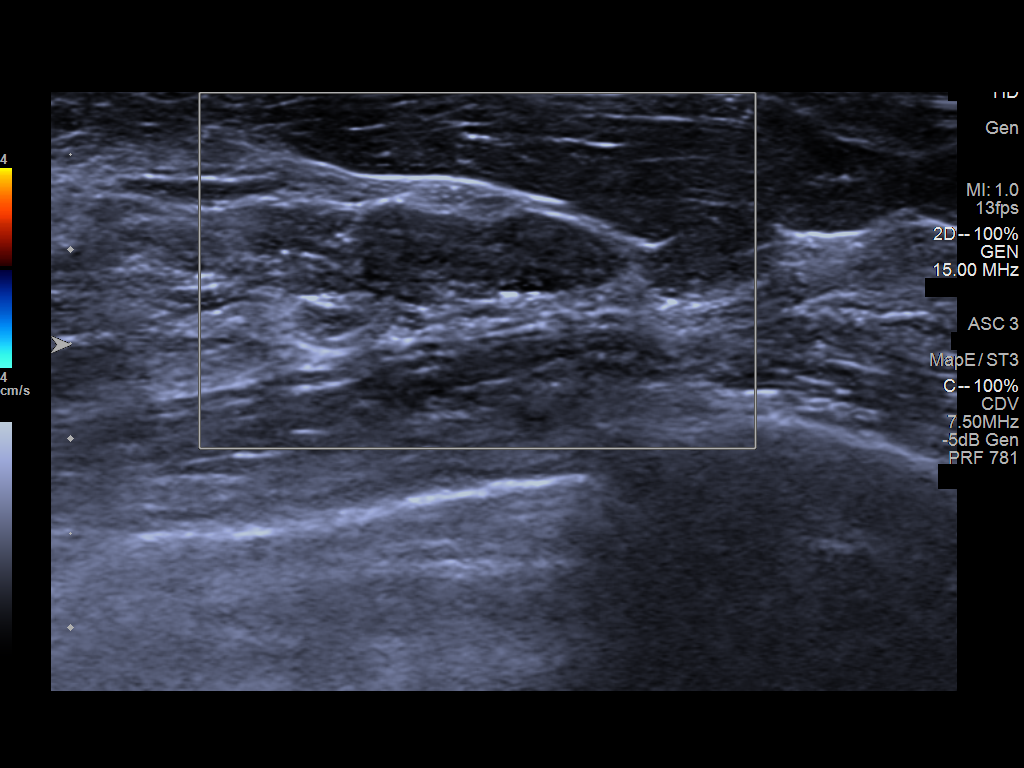

[6 of 6 positions shown; findings below may reference images not displayed]

FINDINGS: Targeted ultrasound is performed, showing an oval, horizontally
oriented, circumscribed, hypoechoic mass containing a thin internal
septation in the 2:30 o'clock position of the left breast, 7 cm from
the nipple. This measures 1.8 x 1.4 x 0.6 cm. This measured 1.7 x
1.6 x 0.5 cm on 04/09/2017.
IMPRESSION: No significant change in a probable fibroadenoma in the 2:30 o'clock
position of the left breast.

RECOMMENDATION:
Bilateral diagnostic mammogram and left breast ultrasound in 6
months.

I have discussed the findings and recommendations with the patient.
Results were also provided in writing at the conclusion of the
visit. If applicable, a reminder letter will be sent to the patient
regarding the next appointment.

BI-RADS CATEGORY  3: Probably benign.

## 2021-02-01 IMAGING — US ULTRASOUND LEFT BREAST LIMITED
1 series · 5 of 5 positions shown · non-contrast
Comparison: Previous exam(s).

Addendum:
CLINICAL DATA: Follow-up for probably benign left breast mass.

EXAM:
DIGITAL DIAGNOSTIC BILATERAL MAMMOGRAM WITH CAD AND TOMO
LEFT BREAST ULTRASOUND

[Series 1: ultrasound left breast limited · 0.06mm/px · 5 of 5 slices shown]
[im 1/5]
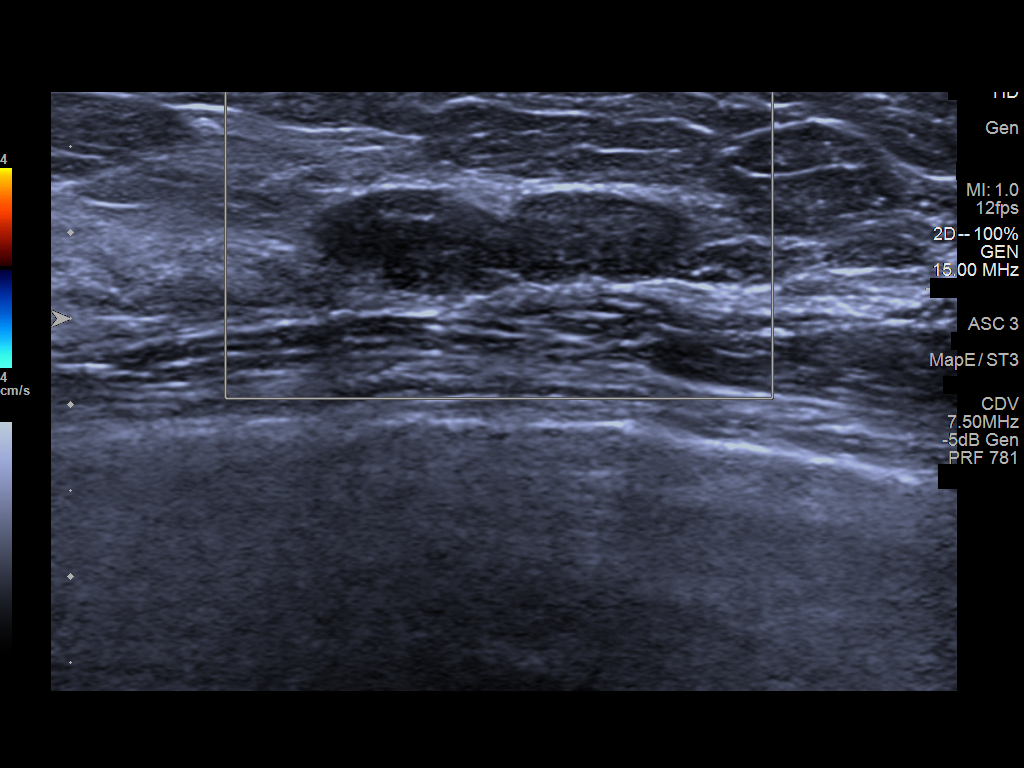
[im 2/5]
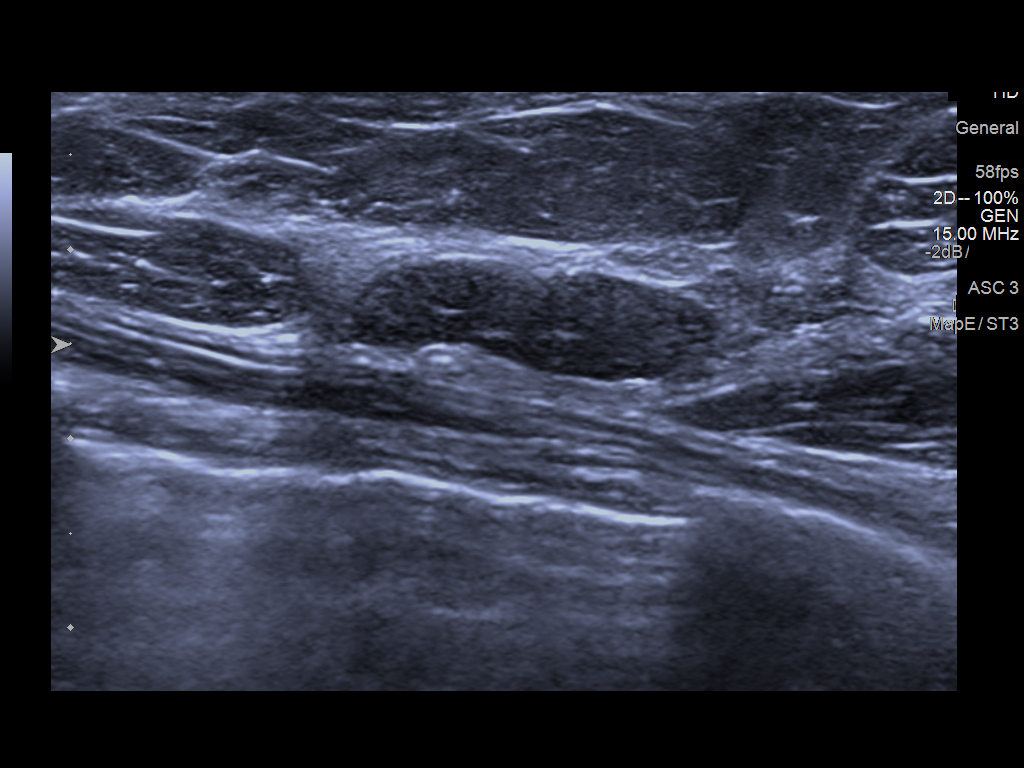
[im 3/5]
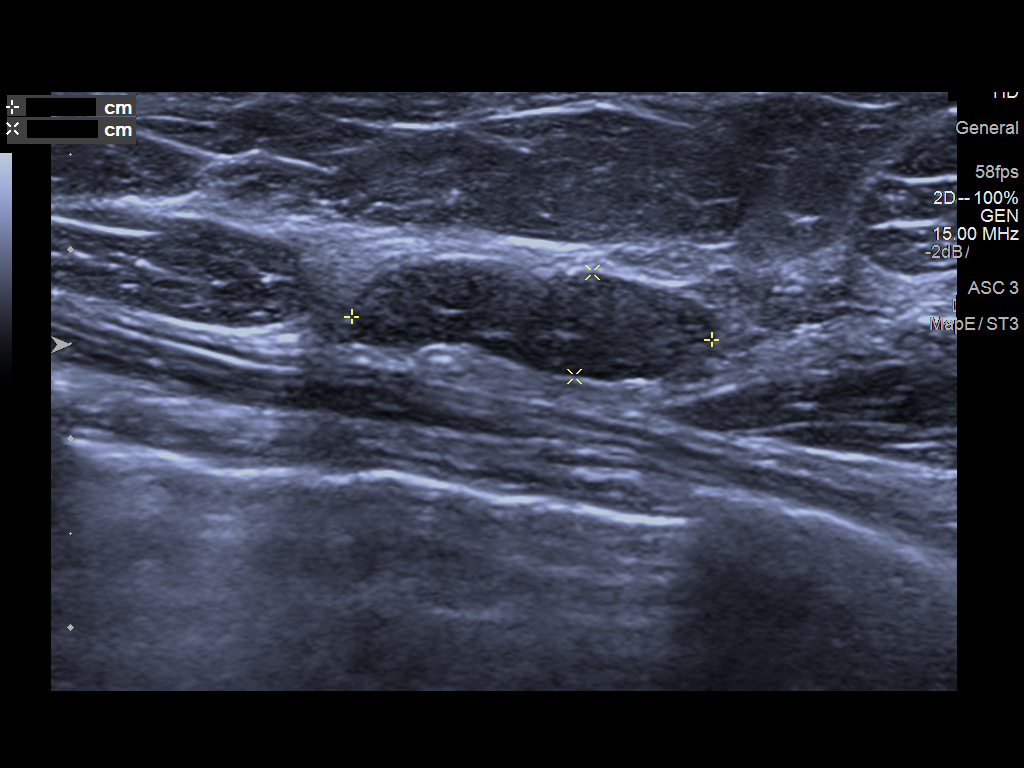
[im 4/5]
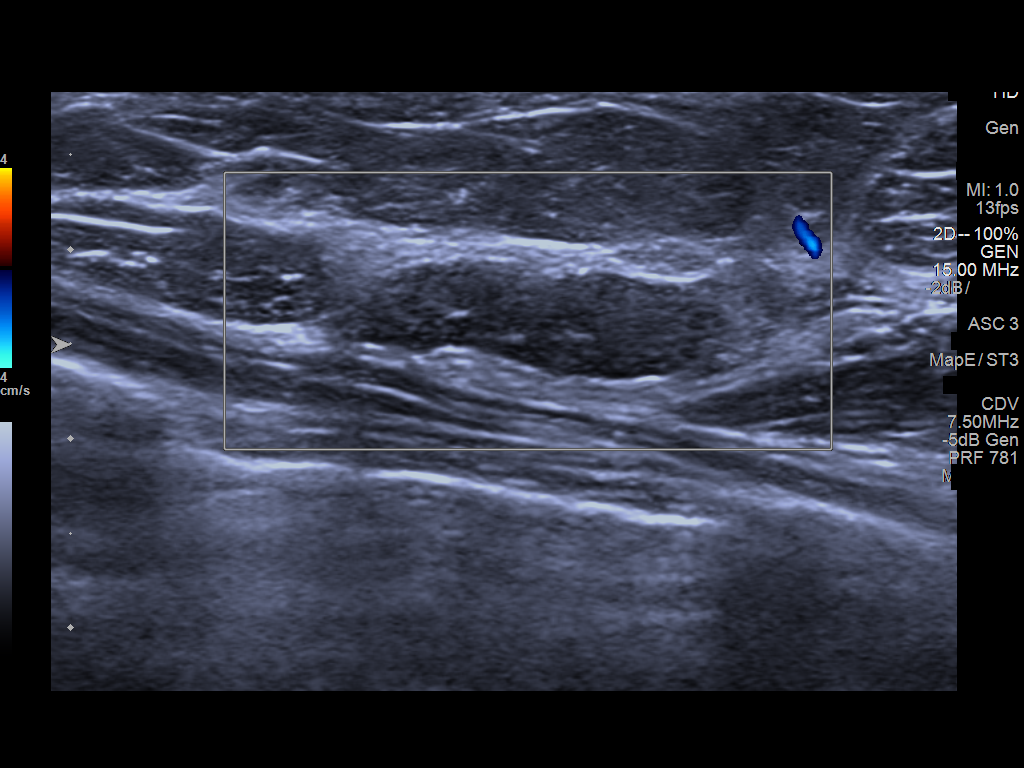
[im 5/5]
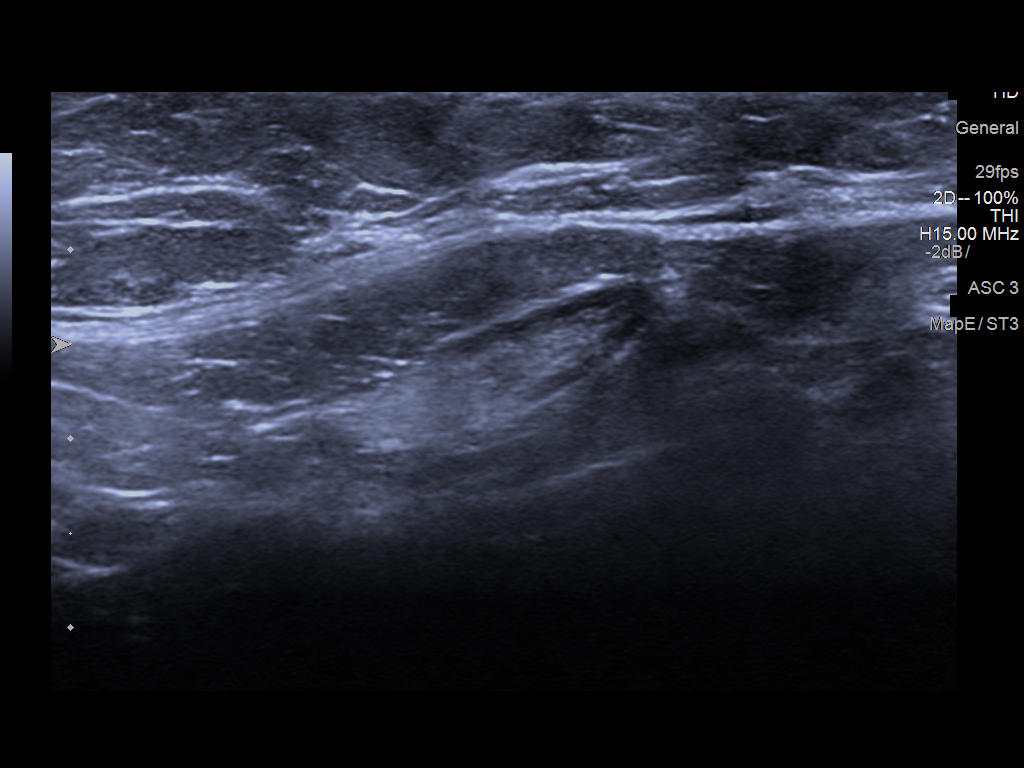

[5 of 5 positions shown; findings below may reference images not displayed]

ACR Breast Density Category c: The breast tissue is heterogeneously
dense, which may obscure small masses.
FINDINGS: No suspicious masses or calcifications are seen in either breast.
The oval mass in the upper-outer left appears overall unchanged
mammographically.

Mammographic images were processed with CAD.

Targeted ultrasound of the upper-outer left breast was performed.
The oval circumscribed gently lobulated mass in the left breast at
the [DATE] position 7 cm from nipple measures 2.2 x 0.6 x 1.9 cm,
previously measured 1.7 x 0.5 x 1.6 cm 04/09/2017 and measured 1.8 x
0.6 x 1.4 cm 10/08/2017. This mass appears to be increasing in size,
although some of this appearance could be due to differences in
imaging and measurement technique. No lymphadenopathy seen in the
left axilla.
IMPRESSION: Indeterminate mass in the left breast at the [DATE] position.

RECOMMENDATION:
Ultrasound-guided biopsy of the mass in the left breast at the [DATE]
position is recommended. The patient would like to discuss the
recommendation with her oncologist prior to scheduling the biopsy.

The patient states she has prior outside mammograms from [HOSPITAL]
[facility]. If these become available for comparison and show this
mass to demonstrate long-term stability, then the recommended biopsy
may potentially be canceled.

I have discussed the findings and recommendations with the patient.
Results were also provided in writing at the conclusion of the
visit. If applicable, a reminder letter will be sent to the patient
regarding the next appointment.

BI-RADS CATEGORY  4: Suspicious.

ADDENDUM:
The patient's prior outside mammograms from 5066 and 7344 have
become available for comparison. This mass is felt to have been
present and similar in appearance when compared to the prior outside
mammograms, with the difference in recent size measurements on
ultrasound felt to be related to the lobulated nature of the mass .

Recommendation: Recommend an additional follow-up diagnostic
mammogram and ultrasound in 12 months. This was discussed with the
patient 03/25/2018 at [DATE] p.m..

BI-RADS category: 3: Probably benign.

*** End of Addendum ***

## 2021-02-14 ENCOUNTER — Ambulatory Visit (HOSPITAL_COMMUNITY): Payer: Self-pay | Admitting: Hematology

## 2023-09-20 DEATH — deceased
